# Patient Record
Sex: Male | Born: 1970 | Race: White | Hispanic: No | Marital: Married | Smoking: Former smoker
Health system: Southern US, Community
[De-identification: ages and names within clinical notes are randomized; demographics above are authoritative.]

## PROBLEM LIST (undated history)

## (undated) DIAGNOSIS — R011 Cardiac murmur, unspecified: Secondary | ICD-10-CM

## (undated) DIAGNOSIS — T7840XA Allergy, unspecified, initial encounter: Secondary | ICD-10-CM

## (undated) DIAGNOSIS — F32A Depression, unspecified: Secondary | ICD-10-CM

## (undated) DIAGNOSIS — E785 Hyperlipidemia, unspecified: Secondary | ICD-10-CM

## (undated) DIAGNOSIS — I10 Essential (primary) hypertension: Secondary | ICD-10-CM

## (undated) HISTORY — DX: Essential (primary) hypertension: I10

## (undated) HISTORY — DX: Hyperlipidemia, unspecified: E78.5

## (undated) HISTORY — DX: Allergy, unspecified, initial encounter: T78.40XA

## (undated) HISTORY — DX: Cardiac murmur, unspecified: R01.1

---
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
## 1992-12-23 HISTORY — PX: OTHER SURGICAL HISTORY: SHX169

## 2009-03-27 ENCOUNTER — Ambulatory Visit: Payer: Self-pay | Admitting: Family Medicine

## 2009-07-27 ENCOUNTER — Ambulatory Visit: Payer: Self-pay | Admitting: Family Medicine

## 2009-07-27 DIAGNOSIS — M719 Bursopathy, unspecified: Secondary | ICD-10-CM

## 2009-07-27 DIAGNOSIS — E782 Mixed hyperlipidemia: Secondary | ICD-10-CM | POA: Insufficient documentation

## 2009-07-27 DIAGNOSIS — R011 Cardiac murmur, unspecified: Secondary | ICD-10-CM | POA: Insufficient documentation

## 2009-07-27 DIAGNOSIS — M67919 Unspecified disorder of synovium and tendon, unspecified shoulder: Secondary | ICD-10-CM | POA: Insufficient documentation

## 2009-07-27 DIAGNOSIS — J309 Allergic rhinitis, unspecified: Secondary | ICD-10-CM | POA: Insufficient documentation

## 2009-07-27 DIAGNOSIS — E785 Hyperlipidemia, unspecified: Secondary | ICD-10-CM | POA: Insufficient documentation

## 2009-07-31 ENCOUNTER — Encounter: Payer: Self-pay | Admitting: Family Medicine

## 2009-08-01 LAB — CONVERTED CEMR LAB
AST: 30 units/L (ref 0–37)
BUN: 14 mg/dL (ref 6–23)
Calcium: 9.2 mg/dL (ref 8.4–10.5)
Chloride: 103 meq/L (ref 96–112)
Cholesterol: 211 mg/dL — ABNORMAL HIGH (ref 0–200)
Creatinine, Ser: 1.13 mg/dL (ref 0.40–1.50)
HDL: 44 mg/dL (ref >39)
Total CHOL/HDL Ratio: 4.8
Triglycerides: 149 mg/dL (ref <150)

## 2010-01-15 ENCOUNTER — Ambulatory Visit: Payer: Self-pay | Admitting: Occupational Medicine

## 2010-01-15 DIAGNOSIS — J020 Streptococcal pharyngitis: Secondary | ICD-10-CM | POA: Insufficient documentation

## 2010-01-26 ENCOUNTER — Ambulatory Visit: Payer: Self-pay | Admitting: Family Medicine

## 2010-01-26 DIAGNOSIS — J069 Acute upper respiratory infection, unspecified: Secondary | ICD-10-CM | POA: Insufficient documentation

## 2010-09-04 ENCOUNTER — Ambulatory Visit: Payer: Self-pay | Admitting: Family Medicine

## 2010-09-04 DIAGNOSIS — R310 Gross hematuria: Secondary | ICD-10-CM | POA: Insufficient documentation

## 2010-09-04 LAB — CONVERTED CEMR LAB
Blood in Urine, dipstick: NEGATIVE
Ketones, urine, test strip: NEGATIVE
Specific Gravity, Urine: 1.02
pH: 7.5

## 2010-09-05 ENCOUNTER — Encounter: Payer: Self-pay | Admitting: Family Medicine

## 2010-09-05 LAB — CONVERTED CEMR LAB
ALT: 36 units/L (ref 0–53)
Alkaline Phosphatase: 54 units/L (ref 39–117)
Bacteria, UA: NONE SEEN
Basophils Absolute: 0 10*3/uL (ref 0.0–0.1)
Creatinine, Ser: 1.09 mg/dL (ref 0.40–1.50)
Crystals: NONE SEEN
Eosinophils Absolute: 0.1 10*3/uL (ref 0.0–0.7)
Eosinophils Relative: 1 % (ref 0–5)
Glucose, Bld: 111 mg/dL — ABNORMAL HIGH (ref 70–99)
HCT: 45.6 % (ref 39.0–52.0)
MCV: 89.8 fL (ref 78.0–100.0)
Monocytes Absolute: 0.4 10*3/uL (ref 0.1–1.0)
Platelets: 241 10*3/uL (ref 150–400)
RDW: 13.3 % (ref 11.5–15.5)
Sodium: 137 meq/L (ref 135–145)
Total Bilirubin: 0.8 mg/dL (ref 0.3–1.2)
Total Protein: 7.6 g/dL (ref 6.0–8.3)

## 2011-01-22 NOTE — Assessment & Plan Note (Signed)
Summary: Fever, sorethroat, headache, x 2 dys rm 3   Vital Signs:  Patient Profile:   40 Years Old Male CC:      Cold & URI symptoms Height:     70.5 inches Weight:      224 pounds O2 Sat:      100 % O2 treatment:    Room Air Temp:     99.5 degrees F oral Pulse rate:   82 / minute Pulse rhythm:   regular Resp:     16 per minute BP sitting:   128 / 82  (right arm) Cuff size:   regular  Vitals Entered By: Lita Mains CMA (January 15, 2010 8:49 AM)                  Current Allergies: ! ASA     History of Present Illness Chief Complaint: Cold & URI symptoms History of Present Illness: Presents with complaints of 2 days of sore throat, low grade fever,  sinus congestion.   Can't sleep due to sore throat.    No cough.    Feels completely miserable.   Also has has  body aches.    Gets strep throat about twice a year.  Able to swallow saliva and drink liquids.   Current Problems: PHARYNGITIS, STREPTOCOCCAL (ICD-034.0) ALLERGIC RHINITIS (ICD-477.9) CARDIAC MURMUR (ICD-785.2) DYSLIPIDEMIA (ICD-272.4) ROTATOR CUFF SYNDROME, LEFT (ICD-726.10)   Current Meds HYDROCODONE-ACETAMINOPHEN 5-325 MG TABS (HYDROCODONE-ACETAMINOPHEN) 1 by mouth up to 4 times per day as needed for pain  REVIEW OF SYSTEMS Constitutional Symptoms       Complains of fever.     Denies chills, night sweats, weight loss, weight gain, and fatigue.  Eyes       Denies change in vision, eye pain, eye discharge, glasses, contact lenses, and eye surgery. Ear/Nose/Throat/Mouth       Complains of frequent runny nose, sinus problems, and sore throat.      Denies hearing loss/aids, change in hearing, ear pain, ear discharge, dizziness, frequent nose bleeds, hoarseness, and tooth pain or bleeding.      Comments: x 2 dys Respiratory       Denies dry cough, productive cough, wheezing, shortness of breath, asthma, bronchitis, and emphysema/COPD.  Cardiovascular       Denies murmurs, chest pain, and tires easily with  exhertion.    Gastrointestinal       Denies stomach pain, nausea/vomiting, diarrhea, constipation, blood in bowel movements, and indigestion. Genitourniary       Denies painful urination, kidney stones, and loss of urinary control. Neurological       Complains of headaches.      Denies paralysis, seizures, and fainting/blackouts. Musculoskeletal       Denies muscle pain, joint pain, joint stiffness, decreased range of motion, redness, swelling, muscle weakness, and gout.  Skin       Denies bruising, unusual mles/lumps or sores, and hair/skin or nail changes.  Psych       Denies mood changes, temper/anger issues, anxiety/stress, speech problems, depression, and sleep problems.  Past History:  Past Medical History: Last updated: 07/27/2009 heart murmur allergies  Past Surgical History: Last updated: 07/27/2009 981 L knee lateral release 1994 R knee lateral release  Family History: Last updated: 07/27/2009 Mother alive Alzheimers in her late 54s Father AMI at 81, HTN. 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  89 yo daughter. Quit smoking in 2005. Cardio/ wts 60 + min 3-4 days/ wk. Goal wt is 200, down from 260. 6-10 ETOH/ wk. Physical Exam General appearance: well developed, well nourished, no acute distress Head: normocephalic, atraumatic Eyes: conjunctivae and lids normal Pupils: equal, round, reactive to light Ears: normal, no lesions or deformities Oral/Pharynx: pharyngeal erythema with exudate, uvula midline without deviation Neck: supple,anterior lymphadenopathy present Chest/Lungs: no rales, wheezes, or rhonchi bilateral, breath sounds equal without effort Assessment New Problems: PHARYNGITIS, STREPTOCOCCAL (ICD-034.0)   Plan New Medications/Changes: HYDROCODONE-ACETAMINOPHEN 5-325 MG TABS  (HYDROCODONE-ACETAMINOPHEN) 1 by mouth up to 4 times per day as needed for pain  #20 x 0, 01/15/2010, Kathrine Haddock MD  New Orders: Est. Patient Level III [id number] Admin of Therapeutic Inj  intramuscular or subcutaneous [96372] Planning Comments:   Biacillin 1.2 million units IM now Viscous lidocaine sample given Hydrocodone for pain Follow up if not better in 2-3 days.   The patient and/or caregiver has been counseled thoroughly with regard to medications prescribed including dosage, schedule, interactions, rationale for use, and possible side effects and they verbalize understanding.  Diagnoses and expected course of recovery discussed and will return if not improved as expected or if the condition worsens. Patient and/or caregiver verbalized understanding.  Prescriptions: HYDROCODONE-ACETAMINOPHEN 5-325 MG TABS (HYDROCODONE-ACETAMINOPHEN) 1 by mouth up to 4 times per day as needed for pain  #20 x 0   Entered and Authorized by:   Kathrine Haddock MD   Signed by:   Kathrine Haddock MD on 01/15/2010   Method used:   Print then Give to Patient   RxID:   [id number]    Medication Administration  Injection # 1:    Medication: Bicillin CR 1.2 million units Injection    Diagnosis: PHARYNGITIS, STREPTOCOCCAL (ICD-034.0)    Route: IM    Site: RUOQ gluteus    Exp Date: 07/23/2012    Lot #: [id number]    Mfr: Brooke Dare     Patient tolerated injection without complications    Given by: Lita Mains RN (January 15, 2010 9:37 AM)  Orders Added: 1)  Est. Patient Level III [30865] 2)  Admin of Therapeutic Inj  intramuscular or subcutaneous [78469]

## 2011-01-22 NOTE — Assessment & Plan Note (Signed)
Summary: Blood in urine   Vital Signs:  Patient profile:   40 year old male Height:      70.5 inches Weight:      226 pounds BMI:     32.08 Pulse rate:   86 / minute BP sitting:   145 / 80  (right arm) Cuff size:   regular  Vitals Entered By: Avon Gully CMA, Duncan Dull) (September 04, 2010 8:22 AM) CC: noticed bld in the urine yesterday, slight discomfort ofater urinating yesterday.   Primary Care Provider:  Seymour Bars DO  CC:  noticed bld in the urine yesterday and slight discomfort ofater urinating yesterday.Marland Kitchen  History of Present Illness: noticed bld in the urine yesterday, slight discomfort ofater urinating yesterday. Few dropsat the end of the penis. Happened after a BM. No hx of kidney stones. Drinks mainly water or coffee. No dysuria, fever or low back pain.  Occ has sharp pains in her groin area.  Usually would go away. No trauma.   Current Medications (verified): 1)  None  Allergies (verified): 1)  ! Asa  Comments:  Nurse/Medical Assistant: The patient's medications and allergies were reviewed with the patient and were updated in the Medication and Allergy Lists. 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  31 yo daughter. Quit smoking in 2005. Cardio/ wts 60 + min 3-4 days/ wk. Goal wt is 200, down from 260. 6-10 ETOH/ wk.  Physical Exam  General:  Well-developed,well-nourished,in no acute distress; alert,appropriate and cooperative throughout examination Head:  Normocephalic and atraumatic without obvious abnormalities. No apparent alopecia or balding. Lungs:  Normal respiratory effort, chest expands symmetrically. Lungs are clear to auscultation, no crackles  or wheezes. Heart:  Normal rate and regular rhythm. S1 and S2 normal without gallop, murmur, click, rub or other extra sounds. Abdomen:  Bowel sounds positive,abdomen soft and non-tender without masses, organomegaly or hernias noted. Msk:  No CVA tenderness.  Skin:  no rashes.   Psych:  Cognition and judgment appear intact. Alert and cooperative with normal attention span and concentration. No apparent delusions, illusions, hallucinations   Impression & Recommendations:  Problem # 1:  GROSS HEMATURIA (ICD-599.71) Discussed unclear etiology at this point. UA today is neg except for potein.He denies any creatine drinks, etc.  Will send micor and culture. Also Check BUN and CR and CBC. If all normal consider repeat UA in 1 week to check for protein again.  Also no pain to suspect urinary stones. He is a former smoker but he is young to have bladder cancer.   The following medications were removed from the medication list:    Clarithromycin 500 Mg Tabs (Clarithromycin) ..... One tab by mouth two times a day  Orders: T-Comprehensive Metabolic Panel [id number]) T-CBC w/Diff [phone]) T-Urine Culture (Spectrum Order) [id number]) T-Urine Microscopic [phone])  Other Orders: UA Dipstick w/o Micro (automated)  (81003)  Contraindications/Deferment of Procedures/Staging:    Test/Procedure: FLU VAX    Reason for deferment: patient declined   Patient Instructions: 1)  We will call you with your lab results later this week.  2)  Call if you have another episode.   Laboratory Results   Urine Tests  Date/Time Received: 09/04/10 Date/Time Reported: 09/04/10  Routine Urinalysis   Color: yellow  Appearance: Clear Glucose: negative   (Normal Range: Negative) Bilirubin: negative   (Normal Range: Negative) Ketone:   negative   (Normal Range: Negative) Spec. Gravity: 1.020   (Normal Range: 1.003-1.035) Blood: negative   (Normal Range: Negative) pH: 7.5   (Normal Range:  5.0-8.0) Protein: trace   (Normal Range: Negative) Urobilinogen: 0.2   (Normal Range: 0-1) Nitrite: negative   (Normal Range: Negative) Leukocyte Esterace: negative   (Normal Range: Negative)

## 2011-01-22 NOTE — Assessment & Plan Note (Signed)
Summary: CHILLS, BODY ACHING/WB   Vital Signs:  Patient Profile:   39 Years Old Male CC:      chills, body aches, fever Height:     70.5 inches O2 Sat:      99 % O2 treatment:    Room Air Temp:     100.0 degrees F oral Pulse rate:   89 / minute Pulse rhythm:   regular Resp:     20 per minute BP sitting:   148 / 89  (right arm) Cuff size:   regular  Pt. in pain?   no  Vitals Entered By: Lita Mains, RN                   Updated Prior Medication List: HYDROCODONE-ACETAMINOPHEN 5-325 MG TABS (HYDROCODONE-ACETAMINOPHEN) 1 by mouth up to 4 times per day as needed for pain  Current Allergies (reviewed today): ! ASAHistory of Present Illness History from: patient Chief Complaint: chills, body aches, fever History of Present Illness: Subjective:  Patient reports that his previous sore throat completely resolved and he felt well.  Yesterday he developed myalgias, chills, fatigue, and tightness over his sternum.  No definite respiratory symptoms.  No GI or GU symptoms.  He has not had a flu shot. He has had six episodes of pneumonia in the past.  REVIEW OF SYSTEMS Constitutional Symptoms       Complains of fever and chills.     Denies night sweats, weight loss, weight gain, and fatigue.      Comments: body aches Eyes       Denies change in vision, eye pain, eye discharge, glasses, contact lenses, and eye surgery. Ear/Nose/Throat/Mouth       Denies hearing loss/aids, change in hearing, ear pain, ear discharge, dizziness, frequent runny nose, frequent nose bleeds, sinus problems, sore throat, hoarseness, and tooth pain or bleeding.  Respiratory       Denies dry cough, productive cough, wheezing, shortness of breath, asthma, bronchitis, and emphysema/COPD.  Cardiovascular       Denies murmurs, chest pain, and tires easily with exhertion.    Gastrointestinal       Denies stomach pain, nausea/vomiting, diarrhea, constipation, blood in bowel movements, and  indigestion. Genitourniary       Denies painful urination, kidney stones, and loss of urinary control. Neurological       Denies paralysis, seizures, and fainting/blackouts. Musculoskeletal       Denies muscle pain, joint pain, joint stiffness, decreased range of motion, redness, swelling, muscle weakness, and gout.  Skin       Denies bruising, unusual mles/lumps or sores, and hair/skin or nail changes.  Psych       Denies mood changes, temper/anger issues, anxiety/stress, speech problems, depression, and sleep problems.  Past History:  Past Medical History: Reviewed history from 07/27/2009 and no changes required. heart murmur allergies  Family History: Reviewed history from 07/27/2009 and no changes required. Mother alive Alzheimers in her late 57s Father AMI at 72, HTN. cousins on mom/ dad's side breast cancer aunt: lung cancer nephew depression 2 brothers/ 1 sister healthy  Social History: Reviewed history from 07/27/2009 and no changes required. 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  58 yo daughter. Quit smoking in 2005. Cardio/ wts 60 + min 3-4 days/ wk. Goal wt is 200, down from 260. 6-10 ETOH/ wk.   Objective:  Appearance:  Patient appears healthy, stated age, and in no acute distress  Eyes:  Pupils are equal, round, and reactive  to light and accomdation.  Extraocular movement is intact.  Conjunctivae are not inflamed.  Ears:  Canals normal.  Tympanic membranes normal.   Nose:  Normal septum.  Normal turbinates, mildly congested.   No sinus tenderness present.  Pharynx:  Normal, moist mucous membranes  Neck:  Supple.  No adenopathy is present.  No thyromegaly is present  Lungs:  Clear to auscultation.  Breath sounds are equal.  Heart:  Regular rate and rhythm without murmurs, rubs, or gallops.  Abdomen:  Nontender without masses or hepatosplenomegaly.  Bowel sounds are present.  No CVA or flank tenderness.  Assessment New Problems: URI (ICD-465.9)  ?  EARLY INFLUENZA VS VIRAL URI  Plan New Medications/Changes: BENZONATATE 200 MG CAPS (BENZONATATE) One by mouth hs as needed cough  #12 x 0, 01/26/2010, Donna Christen MD CLARITHROMYCIN 500 MG TABS (CLARITHROMYCIN) One Tab by mouth two times a day  #20 x 0, 01/26/2010, Donna Christen MD  New Orders: Est. Patient Level III [phone] Planning Comments:   With past history of multiple episodes of pneumonia, will begin Biaxin for 10 days.  Begin expectorant and increased fluids. Cough suppressant at night if cough worsens.  Follow-up with PCP if not improving one week. When well, recommend Pneumovax and Tdap.   The patient and/or caregiver has been counseled thoroughly with regard to medications prescribed including dosage, schedule, interactions, rationale for use, and possible side effects and they verbalize understanding.  Diagnoses and expected course of recovery discussed and will return if not improved as expected or if the condition worsens. Patient and/or caregiver verbalized understanding.  Prescriptions: BENZONATATE 200 MG CAPS (BENZONATATE) One by mouth hs as needed cough  #12 x 0   Entered and Authorized by:   Donna Christen MD   Signed by:   Donna Christen MD on 01/26/2010   Method used:   Print then Give to Patient   RxID:   [id number] CLARITHROMYCIN 500 MG TABS (CLARITHROMYCIN) One Tab by mouth two times a day  #20 x 0   Entered and Authorized by:   Donna Christen MD   Signed by:   Donna Christen MD on 01/26/2010   Method used:   Print then Give to Patient   RxID:   [id number]   Patient Instructions: 1)  May use Mucinex  (guaifenesin) twice daily for congestion. 2)  Increase fluid intake, rest. 3)  May use Afrin nasal spray (or generic oxymetazoline) twice daily for about 5 days.  Also recommend using saline nasal spray several times daily and/or saline nasal irrigation. 4)  Followup with family doctor if not improving one week.

## 2011-06-26 ENCOUNTER — Encounter: Payer: Self-pay | Admitting: Family Medicine

## 2011-06-27 ENCOUNTER — Encounter: Payer: Self-pay | Admitting: Family Medicine

## 2011-06-27 ENCOUNTER — Ambulatory Visit (INDEPENDENT_AMBULATORY_CARE_PROVIDER_SITE_OTHER): Payer: BC Managed Care – PPO | Admitting: Family Medicine

## 2011-06-27 DIAGNOSIS — Z13220 Encounter for screening for lipoid disorders: Secondary | ICD-10-CM

## 2011-06-27 DIAGNOSIS — E785 Hyperlipidemia, unspecified: Secondary | ICD-10-CM

## 2011-06-27 DIAGNOSIS — M7710 Lateral epicondylitis, unspecified elbow: Secondary | ICD-10-CM

## 2011-06-27 DIAGNOSIS — Z130 Encounter for screening for diseases of the blood and blood-forming organs and certain disorders involving the immune mechanism: Secondary | ICD-10-CM

## 2011-06-27 LAB — LIPID PANEL
Cholesterol: 235 mg/dL — ABNORMAL HIGH (ref 0–200)
HDL: 41 mg/dL (ref >39)
LDL Cholesterol: 159 mg/dL — ABNORMAL HIGH (ref 0–99)
Total CHOL/HDL Ratio: 5.7 Ratio
Triglycerides: 176 mg/dL — ABNORMAL HIGH (ref <150)
VLDL: 35 mg/dL (ref 0–40)

## 2011-06-27 NOTE — Patient Instructions (Signed)
Labs today. Will call you w/ results tomorrow.  Try cho pat strap for tennis elbow, ice massage, ibuprofen and if not improved in 2-3 wks, call me and I'll set you up with sports med for injection.

## 2011-06-27 NOTE — Assessment & Plan Note (Signed)
Will try him on a Cho Pat strap, ice massage and ibuprofen, avoiding repetitive work for the next 2 wks.  Call if not improving in 2-3 wks and will get him in with sports med if not improving.

## 2011-06-27 NOTE — Assessment & Plan Note (Signed)
Hx of high cholesterol and was told that his cholesterol was high at work.  Will repeat FASTING today and f/u results tomorrow, off meds.

## 2011-06-27 NOTE — Progress Notes (Signed)
  Subjective:    Patient ID: Hector Chavez, male    DOB: [birth year], 40 y.o.   MRN: [id number]  HPI  40 yo WM presents for a health assesment done at work.  He was told that his cholesterol was high but he was not fasting.  He was also told that his sugar was elevated but he not fasting.  He had been on cholesterol meds about 2 yrs ago.    He has pain in the L elbow for a couple months over the L side.  Denies overuse at work or playing raquet sports.  Has used ice, ibuprofen with some help.  No redness or swelling.  Worse if he picks up a gallon of milk from the fridge.    BP 129/78  Pulse 74  Wt 228 lb (103.42 kg)  SpO2 98%   Review of Systems  Respiratory: Negative for shortness of breath.   Cardiovascular: Negative for chest pain.  Musculoskeletal: Positive for arthralgias (L elbow only). Negative for myalgias and joint swelling.  Skin: Negative for color change.       Objective:   Physical Exam  Constitutional: He appears well-developed and well-nourished.  Neck: No thyromegaly present.  Cardiovascular: Normal rate, regular rhythm and normal heart sounds.   Pulmonary/Chest: Effort normal and breath sounds normal.  Musculoskeletal: Normal range of motion. He exhibits no edema.       Left elbow: He exhibits normal range of motion, no swelling and no effusion. tenderness found. Lateral epicondyle tenderness noted.  Neurological:       Grip + 5/5 bilat  Skin: Skin is warm and dry. No erythema.          Assessment & Plan:

## 2011-06-28 ENCOUNTER — Telehealth: Payer: Self-pay | Admitting: Family Medicine

## 2011-06-28 DIAGNOSIS — E785 Hyperlipidemia, unspecified: Secondary | ICD-10-CM | POA: Insufficient documentation

## 2011-06-28 LAB — COMPLETE METABOLIC PANEL WITH GFR
ALT: 43 U/L (ref 0–53)
AST: 32 U/L (ref 0–37)
Alkaline Phosphatase: 46 U/L (ref 39–117)
BUN: 16 mg/dL (ref 6–23)
Calcium: 9.5 mg/dL (ref 8.4–10.5)
Chloride: 106 mEq/L (ref 96–112)
Creat: 1.06 mg/dL (ref 0.50–1.35)

## 2011-06-28 MED ORDER — ROSUVASTATIN CALCIUM 5 MG PO TABS: 5.0000 mg | ORAL_TABLET | Freq: Every day | ORAL | Status: DC

## 2011-06-28 NOTE — Telephone Encounter (Signed)
Pls let pt know that his fasting sugar, liver and kidney function came back normal.  His cholesterol is elevated at 235, TGs elevated at 176, LDL bad chol is 159 and it should be < 100.  Will start him on cholesterol lowering medication to get him to goal.    RX sent to pharmacy for Crestor 5 mg qhs.  Call if any problems.  Repeat labs in 8 wks.

## 2011-06-28 NOTE — Telephone Encounter (Signed)
Pt aware of the above  

## 2012-12-14 ENCOUNTER — Telehealth: Payer: Self-pay | Admitting: *Deleted

## 2012-12-14 MED ORDER — OSELTAMIVIR PHOSPHATE 75 MG PO CAPS: 75.0000 mg | ORAL_CAPSULE | Freq: Two times a day (BID) | ORAL | Status: DC

## 2012-12-14 NOTE — Telephone Encounter (Signed)
Pt would like a rx for tamiflu sent to [facility] in [place] because his daughter has been dx with the flu

## 2012-12-14 NOTE — Telephone Encounter (Signed)
Rx sent for Mr and Mrs Col[facility]oun 

## 2012-12-14 NOTE — Telephone Encounter (Signed)
Pt aware.

## 2016-11-30 ENCOUNTER — Emergency Department (INDEPENDENT_AMBULATORY_CARE_PROVIDER_SITE_OTHER): Payer: BLUE CROSS/BLUE SHIELD

## 2016-11-30 ENCOUNTER — Encounter: Payer: Self-pay | Admitting: Emergency Medicine

## 2016-11-30 ENCOUNTER — Emergency Department
Admission: EM | Admit: 2016-11-30 | Discharge: 2016-11-30 | Disposition: A | Discharge status: Home / Self Care | Payer: BLUE CROSS/BLUE SHIELD | Source: Home / Self Care | Attending: Emergency Medicine | Admitting: Emergency Medicine

## 2016-11-30 DIAGNOSIS — M79641 Pain in right hand: Secondary | ICD-10-CM

## 2016-11-30 DIAGNOSIS — M25521 Pain in right elbow: Secondary | ICD-10-CM

## 2016-11-30 DIAGNOSIS — M25541 Pain in joints of right hand: Secondary | ICD-10-CM

## 2016-11-30 DIAGNOSIS — M778 Other enthesopathies, not elsewhere classified: Secondary | ICD-10-CM

## 2016-11-30 NOTE — ED Provider Notes (Signed)
[facility]    CSN: [id number] Arrival date & time: 11/30/16  1249     History   Chief Complaint Chief Complaint  Patient presents with  . Elbow Pain    right    HPI Hector Chavez is a 45 y.o. male.   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  Took tylenol this morning.-It helped minimally. The pain is sharp and dull, right lateral elbow, exacerbated by handgrip. Now he feels the pain can radiate into the right lateral hand fourth and fifth metacarpal area. No definite numbness or weakness. Denies chest pain or shortness of breath or GI symptoms or fever or chills or nausea or vomiting.   Past Medical History:  Diagnosis Date  . Allergy   . Heart murmur     Patient Active Problem List   Diagnosis Date Noted  . Hyperlipidemia 06/28/2011  . Lateral epicondylitis 06/27/2011  . GROSS HEMATURIA 09/04/2010  . DYSLIPIDEMIA 07/27/2009  . ALLERGIC RHINITIS 07/27/2009  . ROTATOR CUFF SYNDROME, LEFT 07/27/2009  . CARDIAC MURMUR 07/27/2009    Past Surgical History:  Procedure Laterality Date  . LT knee lateral release  1981  . RT knee lateral release  1994       Home Medications    Prior to Admission medications   Medication Sig Start Date End Date Taking? Authorizing Provider  amphetamine-dextroamphetamine (ADDERALL XR) 20 MG 24 hr capsule Take 20 mg by mouth daily.   Yes Historical Provider, MD  oseltamivir (TAMIFLU) 75 MG capsule Take 1 capsule (75 mg total) by mouth 2 (two) times daily. 12/14/12   Sean Hommel, DO  rosuvastatin (CRESTOR) 5 MG tablet Take 1 tablet (5 mg total) by mouth at bedtime. 06/28/11 06/27/12  Glennis BrinkKaren E Bowen, DO    Family History Family History  Problem Relation Age of Onset  . Dementia Mother 1860    (alzheimers)  . Heart attack Father 660  . Hypertension Father   .  Cancer      lung/aunt/breast/cousins/mom and dads side  . Depression      nephew    Social History Social History  Substance Use Topics  . Smoking status: Former Smoker    Quit date: 12/24/2003  . Smokeless tobacco: Never Used  . Alcohol use 0.0 oz/week    6 - 10 Standard drinks or equivalent per week     Comment: per weel     Allergies   Aspirin   Review of Systems Review of Systems  All other systems reviewed and are negative.    Physical Exam Triage Vital Signs ED Triage Vitals  Enc Vitals Group     BP 11/30/16 1318 132/82     Pulse Rate 11/30/16 1318 71     Resp 11/30/16 1318 16     Temp 11/30/16 1318 98.1 F (36.7 C)     Temp Source 11/30/16 1318 Oral     SpO2 11/30/16 1318 97 %     Weight --      Height 11/30/16 1319 5\' 11"  (1.803 m)     Head Circumference --      Peak Flow --      Pain Score 11/30/16 1320 7     Pain Loc --      Pain Edu? --      Excl. in GC? --  No data found.   Updated Vital Signs BP 132/82 (BP Location: Left Arm)   Pulse 71   Temp 98.1 F (36.7 C) (Oral)   Resp 16   Ht 5\' 11"  (1.803 m)   SpO2 97%   Visual Acuity Right Eye Distance:   Left Eye Distance:   Bilateral Distance:    Right Eye Near:   Left Eye Near:    Bilateral Near:     Physical Exam  Constitutional: He is oriented to person, place, and time. He appears well-developed and well-nourished. No distress.  HENT:  Head: Normocephalic and atraumatic.  Eyes: Pupils are equal, round, and reactive to light. No scleral icterus.  Neck: Normal range of motion. Neck supple.  Cardiovascular: Normal rate and regular rhythm.   Pulmonary/Chest: Effort normal.  Abdominal: He exhibits no distension.  Musculoskeletal:  Very tender, mild swelling right lateral elbow. Right lateral elbow pain exacerbated by handgrip and extension of wrist. Mild decreased range of motion of right elbow. No open wound. Right wrist: No tenderness or deformity Right hand: Mild to moderate  tenderness to deep palpation over fourth and fifth metacarpals, with minimal swelling. No ecchymosis. Neurovascular distally intact.  Neurological: He is alert and oriented to person, place, and time. No cranial nerve deficit.  Skin: Skin is warm and dry.  Psychiatric: He has a normal mood and affect. His behavior is normal.  Vitals reviewed.    UC Treatments / Results  Labs (all labs ordered are listed, but only abnormal results are displayed) Labs Reviewed - No data to display  EKG  EKG Interpretation None       Radiology Dg Elbow Complete Right  Result Date: 11/30/2016 CLINICAL DATA:  Right with pain and swelling 2 months.  No injury. EXAM: RIGHT ELBOW - COMPLETE 3+ VIEW COMPARISON:  None. FINDINGS: Well-defined pedunculated osteochondroma over the distal humeral diametaphyseal region. No evidence of acute fracture or dislocation. IMPRESSION: No acute findings. Electronically Signed   By: Elberta Fortisaniel  Boyle M.D.   On: 11/30/2016 15:08   Dg Hand Complete Right  Result Date: 11/30/2016 CLINICAL DATA:  Right hand pain and swelling with weakness 2 months. No injury. EXAM: RIGHT HAND - COMPLETE 3+ VIEW COMPARISON:  None. FINDINGS: Mild degenerate change of the radiocarpal joint. Alignment and bone mineralization are normal. No significant erosive change. No acute fracture or dislocation. IMPRESSION: No acute findings. Mild degenerate change of the radiocarpal joint. Electronically Signed   By: Elberta Fortisaniel  Boyle M.D.   On: 11/30/2016 15:10    Procedures Procedures (including critical care time)  Medications Ordered in UC Medications - No data to display   Initial Impression / Assessment and Plan / UC Course  I have reviewed the triage vital signs and the nursing notes.  Pertinent labs & imaging results that were available during my care of the patient were reviewed by me and considered in my medical decision making (see chart for details).  Clinical Course as of Dec 01 2019  Sat Nov 30, 2016  1440 Patient examined. X-rays ordered  [DM]  1522 X-ray right hand and elbow negative for any fracture or acute abnormality. Reviewed and discussed with patient. Gave him print out of x-ray reports.  [DM]    Clinical Course User Index [DM] Lajean Manesavid Massey, MD   Gave various written information on tennis elbow and tendinitis of elbow, including modification of worksite ergonomically as he is on a computer at work all day.  Special splint for tennis elbow applied right  elbow with instructions Gave copy of x-ray reports He prefers to take ibuprofen 600 mg 3 times a day with food Follow-up with your orthopedist in 5-7 days if not improving, or sooner if symptoms become worse. Precautions discussed. Red flags discussed. Questions invited and answered. Patient voiced understanding and agreement.   Final Clinical Impressions(s) / UC Diagnoses   Final diagnoses:  Right elbow tendinitis  Right hand pain    New Prescriptions Discharge Medication List as of 11/30/2016  3:45 PM    Ibuprofen 600 mg 3 times a day with food.   Lajean Manes, MD 11/30/16 2021

## 2016-11-30 NOTE — ED Triage Notes (Signed)
Patient reports right elbow pain that radiates to wrist and hand for "couple months"; no history of injury or strain. Took tylenol this morning.

## 2016-11-30 NOTE — ED Triage Notes (Signed)
Patient rolled out of bed last night and landed on right lower arm/elbow and hit left temple of forehead; cradling arm; feels light headed and states empty stomach; took nothing for pain.

## 2016-11-30 NOTE — ED Triage Notes (Signed)
2:01 entry erroneous. pak

## 2017-01-22 ENCOUNTER — Other Ambulatory Visit: Payer: Self-pay | Admitting: *Deleted

## 2017-01-22 MED ORDER — OSELTAMIVIR PHOSPHATE 75 MG PO CAPS: 75.0000 mg | ORAL_CAPSULE | Freq: Two times a day (BID) | ORAL | 0 refills | Status: DC

## 2017-01-22 NOTE — Telephone Encounter (Signed)
Informed pt's wife that he will need to est care. She made an appt for him. Advised that he should arrive 15 mins early to complete forms. Laureen Ochs.Patria Warzecha, Viann Shoveonya Lynetta

## 2017-01-22 NOTE — Telephone Encounter (Signed)
Pt's wife called and asked if Dr. Linford ArnoldMetheney could send rx for tamiflu for her husband. Their daughter was dx with flu today.Loralee PacasBarkley, Kenyada Hy WatervilleLynetta

## 2017-01-30 ENCOUNTER — Other Ambulatory Visit: Payer: Self-pay

## 2017-01-30 ENCOUNTER — Encounter: Payer: Self-pay | Admitting: Physician Assistant

## 2017-01-30 ENCOUNTER — Ambulatory Visit (INDEPENDENT_AMBULATORY_CARE_PROVIDER_SITE_OTHER): Payer: BLUE CROSS/BLUE SHIELD | Admitting: Physician Assistant

## 2017-01-30 VITALS — BP 138/90 | HR 67 | Ht 71.0 in | Wt 251.0 lb

## 2017-01-30 DIAGNOSIS — Z0000 Encounter for general adult medical examination without abnormal findings: Secondary | ICD-10-CM | POA: Diagnosis not present

## 2017-01-30 DIAGNOSIS — R002 Palpitations: Secondary | ICD-10-CM

## 2017-01-30 DIAGNOSIS — G4719 Other hypersomnia: Secondary | ICD-10-CM

## 2017-01-30 DIAGNOSIS — R0683 Snoring: Secondary | ICD-10-CM

## 2017-01-30 DIAGNOSIS — N411 Chronic prostatitis: Secondary | ICD-10-CM

## 2017-01-30 DIAGNOSIS — Z8774 Personal history of (corrected) congenital malformations of heart and circulatory system: Secondary | ICD-10-CM | POA: Diagnosis not present

## 2017-01-30 DIAGNOSIS — F321 Major depressive disorder, single episode, moderate: Secondary | ICD-10-CM

## 2017-01-30 DIAGNOSIS — E782 Mixed hyperlipidemia: Secondary | ICD-10-CM

## 2017-01-30 DIAGNOSIS — N401 Enlarged prostate with lower urinary tract symptoms: Secondary | ICD-10-CM

## 2017-01-30 DIAGNOSIS — R3914 Feeling of incomplete bladder emptying: Secondary | ICD-10-CM

## 2017-01-30 LAB — LIPID PANEL W/REFLEX DIRECT LDL
Cholesterol: 251 mg/dL — ABNORMAL HIGH (ref <200)
HDL: 49 mg/dL (ref >40)
LDL-Cholesterol: 178 mg/dL — ABNORMAL HIGH
Non-HDL Cholesterol (Calc): 202 mg/dL — ABNORMAL HIGH (ref <130)
Total CHOL/HDL Ratio: 5.1 Ratio — ABNORMAL HIGH (ref <5.0)
Triglycerides: 115 mg/dL (ref <150)

## 2017-01-30 LAB — URINALYSIS, ROUTINE W REFLEX MICROSCOPIC
BILIRUBIN URINE: NEGATIVE
GLUCOSE, UA: NEGATIVE
HGB URINE DIPSTICK: NEGATIVE
KETONES UR: NEGATIVE
LEUKOCYTES UA: NEGATIVE
Nitrite: NEGATIVE
PROTEIN: NEGATIVE
Specific Gravity, Urine: 1.021 (ref 1.001–1.035)
pH: 7 (ref 5.0–8.0)

## 2017-01-30 LAB — CBC
HEMATOCRIT: 46.5 % (ref 38.5–50.0)
HEMOGLOBIN: 15.8 g/dL (ref 13.2–17.1)
MCH: 30.2 pg (ref 27.0–33.0)
MCHC: 34 g/dL (ref 32.0–36.0)
MCV: 88.9 fL (ref 80.0–100.0)
MPV: 9.6 fL (ref 7.5–12.5)
Platelets: 249 10*3/uL (ref 140–400)
RBC: 5.23 MIL/uL (ref 4.20–5.80)
RDW: 14 % (ref 11.0–15.0)
WBC: 6.7 10*3/uL (ref 3.8–10.8)

## 2017-01-30 LAB — COMPREHENSIVE METABOLIC PANEL
ALT: 51 U/L — ABNORMAL HIGH (ref 9–46)
AST: 39 U/L (ref 10–40)
Albumin: 4.7 g/dL (ref 3.6–5.1)
Alkaline Phosphatase: 54 U/L (ref 40–115)
BUN: 13 mg/dL (ref 7–25)
CALCIUM: 9.4 mg/dL (ref 8.6–10.3)
CO2: 26 mmol/L (ref 20–31)
CREATININE: 1.12 mg/dL (ref 0.60–1.35)
Chloride: 103 mmol/L (ref 98–110)
GLUCOSE: 102 mg/dL — AB (ref 65–99)
Potassium: 4.3 mmol/L (ref 3.5–5.3)
Sodium: 139 mmol/L (ref 135–146)
Total Bilirubin: 0.6 mg/dL (ref 0.2–1.2)
Total Protein: 7.7 g/dL (ref 6.1–8.1)

## 2017-01-30 MED ORDER — CIPROFLOXACIN HCL 750 MG PO TABS: 750.0000 mg | ORAL_TABLET | Freq: Two times a day (BID) | ORAL | 0 refills | Status: AC

## 2017-01-30 MED ORDER — ESCITALOPRAM OXALATE 20 MG PO TABS: ORAL_TABLET | ORAL | 1 refills | Status: DC

## 2017-01-30 NOTE — Patient Instructions (Addendum)
Go downstairs for blood work. We will contact you within 48 hours with results  Take Antibiotic (cipro) twice a day for 14 days Make an appointment with your Urologist for follow-up  Start Lexapro 1/2 tab (10mg ) daily for 1 week. Then increase to full tab (20mg ) daily. I am also prescribing 20-30 minutes of cardiovascular exercise (brisk walk, jog, biking) at least 2 days per week. Follow-up in 1 month  I have placed a referral for a sleep study. They will be contacting you within 5 business days  I have also ordered an Echocardiogram (ultrasound of your heart). [place] take place [place]. They will contact you to schedule

## 2017-01-30 NOTE — Progress Notes (Signed)
HPI:                                                                Hector Chavez is a 46 y.o. male who [facility] today to establish care  Current Concerns include  Hx of Prostatitis - saw urology 2 years ago for asymptomatic hematospermia. Treated empirically with doxycycline. Recommended transrectal ultrasound if symptoms persisted, but patient was lost to follow-up. Patient states for the last few months he has had brownish ejaculatory discharge and pain with ejaculation. Additionally he endorses some obstructive symptoms including incomplete bladder emptying, frequency, and interrupted urinary flow.  Palpitations/history of murmur: patient states he has a history of CHD for which he was followed by pediatric cardiology on a regular basis. He is unsure of his exact cardiac abnormality, but thinks it was possibly a septal defect and mitral valve prolapse. Patient states that recently he has experienced palpitations at rest lasting for a few seconds. Denies associated chest pain, diaphoresis, or presyncope. Denies any episodes of chest pain or syncope with exertion. He does take Adderall 30mg  daily for ADHD. Patient also endorses snoring and that his wife has witnessed periods where he has stopped breathing in his sleep. He has never had a sleep study. Family hx is significant for HTN and MI in his father.  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 He is working long hours and no longer has time for exercise. He reports he has gained weight. He endorses anhedonia, hopelessness, and suicidal thoughts. He does not have a plan and has no history of suicidal behavior. He reports that he has a good support system at home. Denies AH/VH.   Health Maintenance Health Maintenance  Topic Date Due  . HIV Screening  04/18/1984  . TETANUS/TDAP  04/18/1988  . INFLUENZA  VACCINE  08/23/2017 (Originally 07/23/2016)     Health Habits  Diet: "poor"  Exercise: no  ETOH: 15 drinks/week  Tobacco: no, former  Drugs: no  Past Medical History:  Diagnosis Date  . Allergy   . Heart murmur   . Hyperlipidemia    Past Surgical History:  Procedure Laterality Date  . LT knee lateral release  1981  . RT knee lateral release  1994   Social History  Substance Use Topics  . Smoking status: Former Smoker    Quit date: 12/24/2003  . Smokeless tobacco: Never Used  . Alcohol use 0.0 oz/week    6 - 10 Standard drinks or equivalent per week     Comment: per weel   family history includes Brain cancer in his father; Dementia (age of onset: 1460) in his mother; Heart attack (age of onset: 3260) in his father; Hypertension in his father.  ROS: negative except as noted in the HPI  Medications: Current Outpatient Prescriptions  Medication Sig Dispense Refill  . amphetamine-dextroamphetamine (ADDERALL XR) 20 MG 24 hr capsule Take 20 mg by mouth daily.    . ciprofloxacin (CIPRO) 750 MG tablet Take 1 tablet (750 mg total) by mouth 2 (two) times daily. 28 tablet 0  . escitalopram (LEXAPRO) 20 MG tablet One half tab daily for a week then one tab by  mouth daily 30 tablet 1   No current facility-administered medications for this visit.    Allergies  Allergen Reactions  . Aspirin     REACTION: Dizzy, flushed in face       Objective:  BP 138/90   Pulse 67   Ht 5\' 11"  (1.803 m)   Wt 251 lb (113.9 kg)   BMI 35.01 kg/m  Gen: well-groomed, cooperative, not ill-appearing, no distress HEENT: normal conjunctiva, TM's clear, oropharynx clear, moist mucus membranes, no thyromegaly or tenderness Pulm: Normal work of breathing, clear to auscultation bilaterally CV: Normal rate, regular rhythm, s1 and s2 distinct, no murmurs, clicks or rubs appreciated on this exam, no carotid bruit GI: soft, obese, nondistended, nontender, no masses Neuro: alert and oriented x 3, EOM's  intact, PERRLA, DTR's intact MSK: strength 5/5 and symmetric, normal gait, distal pulses intact, no peripheral edema Skin: warm and dry, no rashes or lesions on exposed skin Psych: becomes tearful when talking about his job and mood, normal speech and thought content, good insight    No results found.  Depression screen PHQ 2/9 01/30/2017  Decreased Interest 2  Down, Depressed, Hopeless 2  PHQ - 2 Score 4  Altered sleeping 3  Tired, decreased energy 2  Change in appetite 3  Feeling bad or failure about yourself  1  Trouble concentrating 2  Moving slowly or fidgety/restless 1  PHQ-9 Score 16   GAD 7 : Generalized Anxiety Score 01/30/2017  Nervous, Anxious, on Edge 1  Control/stop worrying 1  Worry too much - different things 1  Trouble relaxing 2  Restless 0  Easily annoyed or irritable 1  Afraid - awful might happen 2  Total GAD 7 Score 8      Assessment and Plan: 46 y.o. male with   Snoring, Excessive daytime sleepiness - positive STOP/BANG screen, blood pressure out of range today - Split night study ordered  Palpitations, History of congenital heart defect - ECHOCARDIOGRAM LIMITED BUBBLE STUDY; Future - ECG - Palpitations are likely related to patient's current stress at work, but ordering ECHO to r/o valvular disorder. In addition to anxiety, patient is also taking Adderall for ADHD, which could be contributory.  Chronic prostatitis - treating empirically with Cipro - ciprofloxacin (CIPRO) 750 MG tablet; Take 1 tablet (750 mg total) by mouth 2 (two) times daily.  Dispense: 28 tablet; Refill: 0 - Ua, micro, and cx pending - follow-up with Urology  Encounter for preventative adult health care examination - CBC - Comprehensive metabolic panel - Lipid Panel w/reflex Direct LDL - Hemoglobin A1c - PSA, total and free  Benign prostatic hyperplasia with incomplete bladder emptying - AUASS 5 - mild - PSA, total and free  Moderate single current episode of major  depressive disorder (HCC) - PHQ9 score of 16. Patient has a safety plan and does not need referral to ER today - starting SSRI - close follow-up in 1 month - escitalopram (LEXAPRO) 20 MG tablet; One half tab daily for a week then one tab by mouth daily  Dispense: 30 tablet; Refill: 1  Patient education and anticipatory guidance given Patient agrees with treatment plan Follow-up in 1 month or sooner as needed  Levonne Hubert PA-C

## 2017-01-31 LAB — HEMOGLOBIN A1C
HEMOGLOBIN A1C: 5.2 % (ref <5.7)
MEAN PLASMA GLUCOSE: 103 mg/dL

## 2017-01-31 LAB — PSA, TOTAL AND FREE
PSA, % FREE: 40 % (ref >25)
PSA, FREE: 0.4 ng/mL
PSA, TOTAL: 1 ng/mL (ref <=4.0)

## 2017-02-01 LAB — URINE CULTURE: ORGANISM ID, BACTERIA: NO GROWTH

## 2017-02-02 DIAGNOSIS — N411 Chronic prostatitis: Secondary | ICD-10-CM | POA: Insufficient documentation

## 2017-02-02 DIAGNOSIS — N400 Enlarged prostate without lower urinary tract symptoms: Secondary | ICD-10-CM | POA: Insufficient documentation

## 2017-02-02 DIAGNOSIS — R0683 Snoring: Secondary | ICD-10-CM | POA: Insufficient documentation

## 2017-02-02 DIAGNOSIS — R3914 Feeling of incomplete bladder emptying: Secondary | ICD-10-CM

## 2017-02-02 DIAGNOSIS — Z8774 Personal history of (corrected) congenital malformations of heart and circulatory system: Secondary | ICD-10-CM | POA: Insufficient documentation

## 2017-02-02 DIAGNOSIS — F321 Major depressive disorder, single episode, moderate: Secondary | ICD-10-CM | POA: Insufficient documentation

## 2017-02-02 DIAGNOSIS — R002 Palpitations: Secondary | ICD-10-CM | POA: Insufficient documentation

## 2017-02-02 DIAGNOSIS — E782 Mixed hyperlipidemia: Secondary | ICD-10-CM | POA: Insufficient documentation

## 2017-02-02 DIAGNOSIS — N401 Enlarged prostate with lower urinary tract symptoms: Secondary | ICD-10-CM | POA: Insufficient documentation

## 2017-02-02 DIAGNOSIS — G4719 Other hypersomnia: Secondary | ICD-10-CM | POA: Insufficient documentation

## 2017-02-02 MED ORDER — ATORVASTATIN CALCIUM 10 MG PO TABS: 10.0000 mg | ORAL_TABLET | Freq: Every day | ORAL | 3 refills | Status: DC

## 2017-02-10 ENCOUNTER — Telehealth: Payer: Self-pay | Admitting: Physician Assistant

## 2017-02-10 DIAGNOSIS — G4719 Other hypersomnia: Secondary | ICD-10-CM

## 2017-02-10 DIAGNOSIS — R0683 Snoring: Secondary | ICD-10-CM

## 2017-02-10 NOTE — Telephone Encounter (Signed)
Patient's insurance will not cover lab sleep study, but will cover at home sleep study. New order placed with [facility].

## 2017-02-27 ENCOUNTER — Ambulatory Visit: Payer: BLUE CROSS/BLUE SHIELD | Admitting: Physician Assistant

## 2017-03-07 ENCOUNTER — Other Ambulatory Visit (HOSPITAL_COMMUNITY): Payer: BLUE CROSS/BLUE SHIELD

## 2017-03-21 ENCOUNTER — Encounter (HOSPITAL_COMMUNITY): Payer: Self-pay | Admitting: Physician Assistant

## 2017-03-28 ENCOUNTER — Other Ambulatory Visit: Payer: Self-pay | Admitting: Physician Assistant

## 2017-03-28 DIAGNOSIS — F321 Major depressive disorder, single episode, moderate: Secondary | ICD-10-CM

## 2017-04-07 ENCOUNTER — Telehealth: Payer: Self-pay

## 2017-04-07 ENCOUNTER — Encounter: Payer: Self-pay | Admitting: Physician Assistant

## 2017-04-07 DIAGNOSIS — G4733 Obstructive sleep apnea (adult) (pediatric): Secondary | ICD-10-CM | POA: Insufficient documentation

## 2017-04-07 NOTE — Telephone Encounter (Signed)
Left vm for pt to return call to clinic -EH/RMA  

## 2017-04-07 NOTE — Telephone Encounter (Signed)
-----   Message from [facility], [place] sent at 04/07/2017  3:51 PM EDT ----- Sleep study showed severe sleep apnea Recommend that he come in for a follow-up appointment so we can measure his neck circumference and order his CPAP supplies

## 2017-04-21 ENCOUNTER — Other Ambulatory Visit: Payer: Self-pay | Admitting: Physician Assistant

## 2017-04-21 ENCOUNTER — Ambulatory Visit (HOSPITAL_COMMUNITY): Payer: BLUE CROSS/BLUE SHIELD | Attending: Cardiology

## 2017-04-21 ENCOUNTER — Other Ambulatory Visit: Payer: Self-pay

## 2017-04-21 DIAGNOSIS — Z8774 Personal history of (corrected) congenital malformations of heart and circulatory system: Secondary | ICD-10-CM | POA: Diagnosis not present

## 2017-04-21 DIAGNOSIS — R002 Palpitations: Secondary | ICD-10-CM | POA: Diagnosis not present

## 2017-04-27 ENCOUNTER — Other Ambulatory Visit: Payer: Self-pay | Admitting: Physician Assistant

## 2017-04-27 DIAGNOSIS — F321 Major depressive disorder, single episode, moderate: Secondary | ICD-10-CM

## 2017-05-20 ENCOUNTER — Telehealth: Payer: Self-pay | Admitting: Physician Assistant

## 2017-05-20 DIAGNOSIS — G4733 Obstructive sleep apnea (adult) (pediatric): Secondary | ICD-10-CM

## 2017-05-20 NOTE — Telephone Encounter (Signed)
Patient's CPAP/supplies ordered for autotitration 7-20 cm H2O. Order sent to [facility].

## 2017-06-14 ENCOUNTER — Other Ambulatory Visit: Payer: Self-pay | Admitting: Physician Assistant

## 2017-06-14 DIAGNOSIS — F321 Major depressive disorder, single episode, moderate: Secondary | ICD-10-CM

## 2017-07-04 ENCOUNTER — Telehealth: Payer: Self-pay

## 2017-07-04 NOTE — Telephone Encounter (Signed)
Received fax from [facility] stating that after 3 attempts the pt could not be reached to set up CPAP.

## 2018-01-25 ENCOUNTER — Other Ambulatory Visit: Payer: Self-pay | Admitting: Physician Assistant

## 2018-01-25 DIAGNOSIS — E782 Mixed hyperlipidemia: Secondary | ICD-10-CM

## 2018-02-27 ENCOUNTER — Other Ambulatory Visit: Payer: Self-pay | Admitting: Physician Assistant

## 2018-02-27 DIAGNOSIS — E782 Mixed hyperlipidemia: Secondary | ICD-10-CM

## 2018-04-04 ENCOUNTER — Other Ambulatory Visit: Payer: Self-pay | Admitting: Physician Assistant

## 2018-04-04 DIAGNOSIS — E782 Mixed hyperlipidemia: Secondary | ICD-10-CM

## 2020-02-03 ENCOUNTER — Other Ambulatory Visit: Payer: Self-pay

## 2020-02-03 ENCOUNTER — Ambulatory Visit (INDEPENDENT_AMBULATORY_CARE_PROVIDER_SITE_OTHER): Payer: 59 | Admitting: Family Medicine

## 2020-02-03 ENCOUNTER — Encounter: Payer: Self-pay | Admitting: Family Medicine

## 2020-02-03 DIAGNOSIS — G4733 Obstructive sleep apnea (adult) (pediatric): Secondary | ICD-10-CM | POA: Diagnosis not present

## 2020-02-03 DIAGNOSIS — F411 Generalized anxiety disorder: Secondary | ICD-10-CM | POA: Insufficient documentation

## 2020-02-03 DIAGNOSIS — F321 Major depressive disorder, single episode, moderate: Secondary | ICD-10-CM

## 2020-02-03 MED ORDER — ESCITALOPRAM OXALATE 10 MG PO TABS: 10.0000 mg | ORAL_TABLET | Freq: Every day | ORAL | 1 refills | Status: DC

## 2020-02-03 NOTE — Assessment & Plan Note (Signed)
No longer taking atorvastatin Will update lipid profile at upcoming CPE.

## 2020-02-03 NOTE — Assessment & Plan Note (Signed)
Some increased anxiety, will restart lexapro.  F/u in 6 weeks for this and CPE.

## 2020-02-03 NOTE — Progress Notes (Signed)
Hector Chavez - 49 y.o. male MRN [id number]  Date of birth: 1971-07-04  Subjective Chief Complaint  Patient presents with  . Anxiety    HPI Hector Chavez is a 49 y.o. male with history of GAD, HLD and OSA here today to re-establish care.  He reports he is doing well today.  He has previously been prescribed lexapro for his anxiety.  He doesn't think that he ever took this medication and just "put it away in the sock drawer".  He reports some increased stress recently and would be interested in restarting this. He does report significant daytime fatigue.  He previously had sleep study showing severe OSA.  Attempts to arrange for CPAP through [facility] were unsuccessful at the time.  He is interested in pursuing treatment for this.  He is no longer taking lipitor for HLD.  He plans to schedule CPE to have labs updated.    ROS:  A comprehensive ROS was completed and negative except as noted per HPI      Allergies  Allergen Reactions  . Aspirin Other (See Comments)    REACTION: Dizzy, flushed in face    Past Medical History:  Diagnosis Date  . Allergy   . Heart murmur   . Hyperlipidemia     Past Surgical History:  Procedure Laterality Date  . LT knee lateral release  1981  . RT knee lateral release  1994    Social History   Socioeconomic History  . Marital status: Married    Spouse name: Not on file  . Number of children: Not on file  . Years of education: Not on file  . Highest education level: Not on file  Occupational History  . Not on file  Tobacco Use  . Smoking status: Former Smoker    Quit date: 12/24/2003    Years since quitting: 16.1  . Smokeless tobacco: Never Used  Substance and Sexual Activity  . Alcohol use: Yes    Alcohol/week: 6.0 - 10.0 standard drinks    Types: 6 - 10 Standard drinks or equivalent per week    Comment: per weel  . Drug use: No  . Sexual activity: Yes  Other Topics Concern  . Not on file  Social History Narrative   Production manager for [employer], married, 1 daughter   Social Determinants of Health   Financial Resource Strain:   . Difficulty of Paying Living Expenses: Not on file  Food Insecurity:   . Worried About Programme researcher, broadcasting/film/video in the Last Year: Not on file  . Ran Out of Food in the Last Year: Not on file  Transportation Needs:   . Lack of Transportation (Medical): Not on file  . Lack of Transportation (Non-Medical): Not on file  Physical Activity:   . Days of Exercise per Week: Not on file  . Minutes of Exercise per Session: Not on file  Stress:   . Feeling of Stress : Not on file  Social Connections:   . Frequency of Communication with Friends and Family: Not on file  . Frequency of Social Gatherings with Friends and Family: Not on file  . Attends Religious Services: Not on file  . Active Member of Clubs or Organizations: Not on file  . Attends Banker Meetings: Not on file  . Marital Status: Not on file    Family History  Problem Relation Age of Onset  . Dementia Mother 35       (alzheimers)  . Heart attack Father  89  . Hypertension Father   . Brain cancer Father   . Cancer Other        lung/aunt/breast/cousins/mom and dads side  . Depression Other        nephew    Health Maintenance  Topic Date Due  . COLONOSCOPY  04/19/2019  . INFLUENZA VACCINE  07/24/2019  . TETANUS/TDAP  02/02/2021 (Originally 04/18/1988)  . HIV Screening  02/02/2021 (Originally 04/18/1984)    ----------------------------------------------------------------------------------------------------------------------------------------------------------------------------------------------------------------- Physical Exam BP (!) 142/86   Pulse 89   Temp 98.2 F (36.8 C) (Oral)   Ht 5\' 11"  (1.803 m)   Wt 284 lb (128.8 kg)   BMI 39.61 kg/m   Physical Exam Constitutional:      Appearance: Normal appearance.  HENT:     Head: Normocephalic and atraumatic.  Eyes:     General: No scleral icterus. Cardiovascular:      Rate and Rhythm: Normal rate and regular rhythm.  Pulmonary:     Effort: Pulmonary effort is normal.     Breath sounds: Normal breath sounds.  Musculoskeletal:     Cervical back: Neck supple.  Skin:    General: Skin is warm and dry.  Neurological:     General: No focal deficit present.     Mental Status: He is alert.  Psychiatric:        Mood and Affect: Mood normal.        Behavior: Behavior normal.     ------------------------------------------------------------------------------------------------------------------------------------------------------------------------------------------------------------------- Assessment and Plan  Severe obstructive sleep apnea Unable to contact previously to set up autopap.  Will see if we can use previous sleep study to obtain new machine for him.   DYSLIPIDEMIA No longer taking atorvastatin Will update lipid profile at upcoming CPE.   GAD (generalized anxiety disorder) Some increased anxiety, will restart lexapro.  F/u in 6 weeks for this and CPE.     This visit occurred during the SARS-CoV-2 public health emergency.  Safety protocols were in place, including screening questions prior to the visit, additional usage of staff PPE, and extensive cleaning of exam room while observing appropriate contact time as indicated for disinfecting solutions.

## 2020-02-03 NOTE — Patient Instructions (Addendum)
Great to meet you! We'll work on getting you set up with equipment for sleep apnea.   Lets plan to follow up in 6-8 weeks to see how the lexapro is working.  We'll do a physical and fasting labs at that time.

## 2020-02-03 NOTE — Assessment & Plan Note (Signed)
Unable to contact previously to set up autopap.  Will see if we can use previous sleep study to obtain new machine for him.

## 2020-03-23 ENCOUNTER — Other Ambulatory Visit: Payer: Self-pay

## 2020-03-23 ENCOUNTER — Encounter: Payer: Self-pay | Admitting: Family Medicine

## 2020-03-23 ENCOUNTER — Ambulatory Visit (INDEPENDENT_AMBULATORY_CARE_PROVIDER_SITE_OTHER): Payer: 59 | Admitting: Family Medicine

## 2020-03-23 VITALS — BP 138/80 | HR 76 | Ht 71.0 in | Wt 276.0 lb

## 2020-03-23 DIAGNOSIS — Z0000 Encounter for general adult medical examination without abnormal findings: Secondary | ICD-10-CM

## 2020-03-23 DIAGNOSIS — E782 Mixed hyperlipidemia: Secondary | ICD-10-CM | POA: Diagnosis not present

## 2020-03-23 DIAGNOSIS — Z1211 Encounter for screening for malignant neoplasm of colon: Secondary | ICD-10-CM

## 2020-03-23 DIAGNOSIS — G4719 Other hypersomnia: Secondary | ICD-10-CM | POA: Diagnosis not present

## 2020-03-23 DIAGNOSIS — Z125 Encounter for screening for malignant neoplasm of prostate: Secondary | ICD-10-CM | POA: Diagnosis not present

## 2020-03-23 NOTE — Assessment & Plan Note (Signed)
Well adult Orders Placed This Encounter  Procedures  . COMPLETE METABOLIC PANEL WITH GFR  . CBC  . Lipid Profile  . TSH  . PSA  . Cologuard   Screening: Elects to have cologuard for colon cancer screening.  PSA levels ordered Immunization: UTD Anticipatory guidance/Risk factor reduction:  Discussed cutting back on EtOH use some.  Additional recommendations per AVS.

## 2020-03-23 NOTE — Progress Notes (Signed)
Hector Chavez - 49 y.o. male MRN [id number]  Date of birth: 07/29/1971  Subjective No chief complaint on file.   HPI Hector Chavez is a 49 y.o. male here today for annual exam. He reports that he is doing well and denies new concerns today. He has been more consistent about taking his lexapro and feels that this is working well for him.  He has not heard anything about CPAP equipment.    He is a former smoker.  He denies respiratory symptoms.   He does drink 2-3 glasses of red wine nightly.    He does yard work/household chores and has stationary bike for exercise.   He has started following a healthier diet recently.   Review of Systems  Constitutional: Negative for chills, fever, malaise/fatigue and weight loss.  HENT: Negative for congestion, ear pain and sore throat.   Eyes: Negative for blurred vision, double vision and pain.  Respiratory: Negative for cough and shortness of breath.   Cardiovascular: Negative for chest pain and palpitations.  Gastrointestinal: Negative for abdominal pain, blood in stool, constipation, heartburn and nausea.  Genitourinary: Negative for dysuria and urgency.  Musculoskeletal: Negative for joint pain and myalgias.  Neurological: Negative for dizziness and headaches.  Endo/Heme/Allergies: Does not bruise/bleed easily.  Psychiatric/Behavioral: Negative for depression. The patient is not nervous/anxious and does not have insomnia.     Allergies  Allergen Reactions  . Aspirin Other (See Comments)    REACTION: Dizzy, flushed in face    Past Medical History:  Diagnosis Date  . Allergy   . Heart murmur   . Hyperlipidemia     Past Surgical History:  Procedure Laterality Date  . LT knee lateral release  1981  . RT knee lateral release  1994    Social History   Socioeconomic History  . Marital status: Married    Spouse name: Not on file  . Number of children: Not on file  . Years of education: Not on file  . Highest education level: Not  on file  Occupational History  . Not on file  Tobacco Use  . Smoking status: Former Smoker    Quit date: 12/24/2003    Years since quitting: 16.2  . Smokeless tobacco: Never Used  Substance and Sexual Activity  . Alcohol use: Yes    Alcohol/week: 6.0 - 10.0 standard drinks    Types: 6 - 10 Standard drinks or equivalent per week    Comment: per weel  . Drug use: No  . Sexual activity: Yes  Other Topics Concern  . Not on file  Social History Narrative   Banker for [employer], married, 1 daughter   Social Determinants of Health   Financial Resource Strain:   . Difficulty of Paying Living Expenses:   Food Insecurity:   . Worried About Charity fundraiser in the Last Year:   . Arboriculturist in the Last Year:   Transportation Needs:   . Film/video editor (Medical):   Marland Kitchen Lack of Transportation (Non-Medical):   Physical Activity:   . Days of Exercise per Week:   . Minutes of Exercise per Session:   Stress:   . Feeling of Stress :   Social Connections:   . Frequency of Communication with Friends and Family:   . Frequency of Social Gatherings with Friends and Family:   . Attends Religious Services:   . Active Member of Clubs or Organizations:   . Attends Archivist Meetings:   .  Marital Status:     Family History  Problem Relation Age of Onset  . Dementia Mother 43       (alzheimers)  . Heart attack Father 57  . Hypertension Father   . Brain cancer Father   . Cancer Other        lung/aunt/breast/cousins/mom and dads side  . Depression Other        nephew    Health Maintenance  Topic Date Due  . COLONOSCOPY  Never done  . TETANUS/TDAP  02/02/2021 (Originally 04/18/1988)  . HIV Screening  02/02/2021 (Originally 04/18/1984)  . INFLUENZA VACCINE  07/23/2020      ----------------------------------------------------------------------------------------------------------------------------------------------------------------------------------------------------------------- Physical Exam BP 138/80   Pulse 76   Ht 5\' 11"  (1.803 m)   Wt 276 lb (125.2 kg)   BMI 38.49 kg/m   Physical Exam Constitutional:      General: He is not in acute distress. HENT:     Head: Normocephalic and atraumatic.     Right Ear: Tympanic membrane and external ear normal.     Left Ear: Tympanic membrane and external ear normal.  Eyes:     General: No scleral icterus. Neck:     Thyroid: No thyromegaly.  Cardiovascular:     Rate and Rhythm: Normal rate and regular rhythm.     Heart sounds: Normal heart sounds.  Pulmonary:     Effort: Pulmonary effort is normal.     Breath sounds: Normal breath sounds.  Abdominal:     General: Bowel sounds are normal. There is no distension.     Palpations: Abdomen is soft.     Tenderness: There is no abdominal tenderness. There is no guarding.  Musculoskeletal:     Cervical back: Normal range of motion.  Lymphadenopathy:     Cervical: No cervical adenopathy.  Skin:    General: Skin is warm and dry.     Findings: No rash.  Neurological:     General: No focal deficit present.     Mental Status: He is alert and oriented to person, place, and time.     Cranial Nerves: No cranial nerve deficit.     Motor: No abnormal muscle tone.  Psychiatric:        Mood and Affect: Mood normal.        Behavior: Behavior normal.     ------------------------------------------------------------------------------------------------------------------------------------------------------------------------------------------------------------------- Assessment and Plan  Well adult exam Well adult Orders Placed This Encounter  Procedures  . COMPLETE METABOLIC PANEL WITH GFR  . CBC  . Lipid Profile  . TSH  . PSA  . Cologuard   Screening:  Elects to have cologuard for colon cancer screening.  PSA levels ordered Immunization: UTD Anticipatory guidance/Risk factor reduction:  Discussed cutting back on EtOH use some.  Additional recommendations per AVS.     No orders of the defined types were placed in this encounter.   No follow-ups on file.    This visit occurred during the SARS-CoV-2 public health emergency.  Safety protocols were in place, including screening questions prior to the visit, additional usage of staff PPE, and extensive cleaning of exam room while observing appropriate contact time as indicated for disinfecting solutions.

## 2020-03-23 NOTE — Patient Instructions (Signed)
Preventive Care 41-51 Years Old, Male Preventive care refers to lifestyle choices and visits with your health care provider that can promote health and wellness. This includes:  A yearly physical exam. This is also called an annual well check.  Regular dental and eye exams.  Immunizations.  Screening for certain conditions.  Healthy lifestyle choices, such as eating a healthy diet, getting regular exercise, not using drugs or products that contain nicotine and tobacco, and limiting alcohol use. What can I expect for my preventive care visit? Physical exam Your health care provider will check:  Height and weight. These may be used to calculate body mass index (BMI), which is a measurement that tells if you are at a healthy weight.  Heart rate and blood pressure.  Your skin for abnormal spots. Counseling Your health care provider may ask you questions about:  Alcohol, tobacco, and drug use.  Emotional well-being.  Home and relationship well-being.  Sexual activity.  Eating habits.  Work and work Statistician. What immunizations do I need?  Influenza (flu) vaccine  This is recommended every year. Tetanus, diphtheria, and pertussis (Tdap) vaccine  You may need a Td booster every 10 years. Varicella (chickenpox) vaccine  You may need this vaccine if you have not already been vaccinated. Zoster (shingles) vaccine  You may need this after age 64. Measles, mumps, and rubella (MMR) vaccine  You may need at least one dose of MMR if you were born in 1957 or later. You may also need a second dose. Pneumococcal conjugate (PCV13) vaccine  You may need this if you have certain conditions and were not previously vaccinated. Pneumococcal polysaccharide (PPSV23) vaccine  You may need one or two doses if you smoke cigarettes or if you have certain conditions. Meningococcal conjugate (MenACWY) vaccine  You may need this if you have certain conditions. Hepatitis A  vaccine  You may need this if you have certain conditions or if you travel or work in places where you may be exposed to hepatitis A. Hepatitis B vaccine  You may need this if you have certain conditions or if you travel or work in places where you may be exposed to hepatitis B. Haemophilus influenzae type b (Hib) vaccine  You may need this if you have certain risk factors. Human papillomavirus (HPV) vaccine  If recommended by your health care provider, you may need three doses over 6 months. You may receive vaccines as individual doses or as more than one vaccine together in one shot (combination vaccines). Talk with your health care provider about the risks and benefits of combination vaccines. What tests do I need? Blood tests  Lipid and cholesterol levels. These may be checked every 5 years, or more frequently if you are over 60 years old.  Hepatitis C test.  Hepatitis B test. Screening  Lung cancer screening. You may have this screening every year starting at age 51 if you have a 30-pack-year history of smoking and currently smoke or have quit within the past 15 years.  Prostate cancer screening. Recommendations will vary depending on your family history and other risks.  Colorectal cancer screening. All adults should have this screening starting at age 72 and continuing until age 2. Your health care provider may recommend screening at age 14 if you are at increased risk. You will have tests every 1-10 years, depending on your results and the type of screening test.  Diabetes screening. This is done by checking your blood sugar (glucose) after you have not eaten  for a while (fasting). You may have this done every 1-3 years.  Sexually transmitted disease (STD) testing. Follow these instructions at home: Eating and drinking  Eat a diet that includes fresh fruits and vegetables, whole grains, lean protein, and low-fat dairy products.  Take vitamin and mineral supplements as  recommended by your health care provider.  Do not drink alcohol if your health care provider tells you not to drink.  If you drink alcohol: ? Limit how much you have to 0-2 drinks a day. ? Be aware of how much alcohol is in your drink. In the [place] one drink equals one 12 oz bottle of beer (355 mL), one 5 oz glass of wine (148 mL), or one 1 oz glass of hard liquor (44 mL). Lifestyle  Take daily care of your teeth and gums.  Stay active. Exercise for at least 30 minutes on 5 or more days each week.  Do not use any products that contain nicotine or tobacco, such as cigarettes, e-cigarettes, and chewing tobacco. If you need help quitting, ask your health care provider.  If you are sexually active, practice safe sex. Use a condom or other form of protection to prevent STIs (sexually transmitted infections).  Talk with your health care provider about taking a low-dose aspirin every day starting at age 51. What's next?  Go to your health care provider once a year for a well check visit.  Ask your health care provider how often you should have your eyes and teeth checked.  Stay up to date on all vaccines. This information is not intended to replace advice given to you by your health care provider. Make sure you discuss any questions you have with your health care provider. Document Revised: 12/03/2018 Document Reviewed: 12/03/2018 Elsevier Patient Education  2020 [employer].

## 2020-03-24 LAB — CBC
HCT: 53 % — ABNORMAL HIGH (ref 38.5–50.0)
Hemoglobin: 17.7 g/dL — ABNORMAL HIGH (ref 13.2–17.1)
MCH: 30.6 pg (ref 27.0–33.0)
MCHC: 33.4 g/dL (ref 32.0–36.0)
MCV: 91.5 fL (ref 80.0–100.0)
MPV: 10.7 fL (ref 7.5–12.5)
Platelets: 236 10*3/uL (ref 140–400)
RBC: 5.79 10*6/uL (ref 4.20–5.80)
RDW: 13.1 % (ref 11.0–15.0)
WBC: 6.6 10*3/uL (ref 3.8–10.8)

## 2020-03-24 LAB — COMPLETE METABOLIC PANEL WITH GFR
AG Ratio: 1.4 (calc) (ref 1.0–2.5)
ALT: 51 U/L — ABNORMAL HIGH (ref 9–46)
AST: 35 U/L (ref 10–35)
Albumin: 4.5 g/dL (ref 3.6–5.1)
Alkaline phosphatase (APISO): 68 U/L (ref 35–144)
BUN: 11 mg/dL (ref 7–25)
CO2: 26 mmol/L (ref 20–32)
Calcium: 9.7 mg/dL (ref 8.6–10.3)
Chloride: 102 mmol/L (ref 98–110)
Creat: 1.11 mg/dL (ref 0.70–1.33)
GFR, Est African American: 89 mL/min/{1.73_m2} (ref >=60)
GFR, Est Non African American: 77 mL/min/{1.73_m2} (ref >=60)
Globulin: 3.2 g/dL (calc) (ref 1.9–3.7)
Glucose, Bld: 101 mg/dL — ABNORMAL HIGH (ref 65–99)
Potassium: 4.5 mmol/L (ref 3.5–5.3)
Sodium: 140 mmol/L (ref 135–146)
Total Bilirubin: 0.8 mg/dL (ref 0.2–1.2)
Total Protein: 7.7 g/dL (ref 6.1–8.1)

## 2020-03-24 LAB — LIPID PANEL
Cholesterol: 282 mg/dL — ABNORMAL HIGH (ref <200)
HDL: 47 mg/dL (ref >=40)
LDL Cholesterol (Calc): 195 mg/dL (calc) — ABNORMAL HIGH
Non-HDL Cholesterol (Calc): 235 mg/dL (calc) — ABNORMAL HIGH (ref <130)
Total CHOL/HDL Ratio: 6 (calc) — ABNORMAL HIGH (ref <5.0)
Triglycerides: 210 mg/dL — ABNORMAL HIGH (ref <150)

## 2020-03-24 LAB — TSH: TSH: 1.68 mIU/L (ref 0.40–4.50)

## 2020-03-24 LAB — PSA: PSA: 0.8 ng/mL (ref <=4.0)

## 2020-04-09 ENCOUNTER — Encounter: Payer: Self-pay | Admitting: Family Medicine

## 2020-04-10 ENCOUNTER — Other Ambulatory Visit: Payer: Self-pay | Admitting: Family Medicine

## 2020-04-10 DIAGNOSIS — E782 Mixed hyperlipidemia: Secondary | ICD-10-CM

## 2020-04-10 MED ORDER — ATORVASTATIN CALCIUM 10 MG PO TABS: 10.0000 mg | ORAL_TABLET | Freq: Every day | ORAL | 3 refills | Status: DC

## 2020-04-10 NOTE — Progress Notes (Signed)
Pt has seen results on MyChart and patient has responded on mychart to PCP with any changes.

## 2020-04-30 LAB — COLOGUARD
COLOGUARD: NEGATIVE
Cologuard: NEGATIVE

## 2020-06-07 ENCOUNTER — Encounter: Payer: Self-pay | Admitting: Family Medicine

## 2020-07-05 ENCOUNTER — Ambulatory Visit (INDEPENDENT_AMBULATORY_CARE_PROVIDER_SITE_OTHER): Payer: No Typology Code available for payment source | Admitting: Sports Medicine

## 2020-07-05 ENCOUNTER — Ambulatory Visit (INDEPENDENT_AMBULATORY_CARE_PROVIDER_SITE_OTHER): Payer: No Typology Code available for payment source

## 2020-07-05 DIAGNOSIS — M19041 Primary osteoarthritis, right hand: Secondary | ICD-10-CM

## 2020-07-05 DIAGNOSIS — M7661 Achilles tendinitis, right leg: Secondary | ICD-10-CM

## 2020-07-05 MED ORDER — NITROGLYCERIN 0.2 MG/HR TD PT24: MEDICATED_PATCH | TRANSDERMAL | 11 refills | Status: DC

## 2020-07-05 NOTE — Assessment & Plan Note (Signed)
Tenderness at the DIP, Bouchard and Heberden nodes, minimal pain, this is well controlled with an occasional ibuprofen or Aleve. Adding x-rays, hand rehab exercises, return as needed for this.

## 2020-07-05 NOTE — Progress Notes (Signed)
    Procedures performed today:    None.  Independent interpretation of notes and tests performed by another provider:   None.  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 No Achilles nodules, negative Thompson's test. We are going to start conservatively, topical nitroglycerin, custom orthotics, heel lifts, formal physical therapy with eccentric rehabilitation, return to see me in 6 weeks, injection of the retrocalcaneal bursa if no better.  Primary osteoarthritis of right hand Tenderness at the DIP, Bouchard and Heberden nodes, minimal pain, this is well controlled with an occasional ibuprofen or Aleve. Adding x-rays, hand rehab exercises, return as needed for this.    ___________________________________________ Ihor Austin. Benjamin Stain, M.D., ABFM., CAQSM. Primary Care and Sports Medicine [facility]  Adjunct Instructor of Family Medicine  [employer]

## 2020-07-05 NOTE — Assessment & Plan Note (Addendum)
This is a very pleasant 49 year old male, for some time now he has had pain in his right heel, at the Achilles insertion, worse with prolonged weightbearing. No Achilles nodules, negative Thompson's test. We are going to start conservatively, topical nitroglycerin, custom orthotics, heel lifts, formal physical therapy with eccentric rehabilitation, return to see me in 6 weeks, injection of the retrocalcaneal bursa if no better.

## 2020-07-20 ENCOUNTER — Other Ambulatory Visit: Payer: Self-pay

## 2020-07-20 ENCOUNTER — Encounter: Payer: Self-pay | Admitting: Physical Therapy

## 2020-07-20 ENCOUNTER — Ambulatory Visit (INDEPENDENT_AMBULATORY_CARE_PROVIDER_SITE_OTHER): Payer: No Typology Code available for payment source | Admitting: Physical Therapy

## 2020-07-20 DIAGNOSIS — M25671 Stiffness of right ankle, not elsewhere classified: Secondary | ICD-10-CM

## 2020-07-20 DIAGNOSIS — M25541 Pain in joints of right hand: Secondary | ICD-10-CM

## 2020-07-20 DIAGNOSIS — M25571 Pain in right ankle and joints of right foot: Secondary | ICD-10-CM

## 2020-07-20 NOTE — Patient Instructions (Signed)
Access Code: [email or web address] URL: [email or web address] Date: 07/20/2020 Prepared by: Roderic Scarce  Exercises Putty Squeezes - 10 reps Resisted Finger Extension and Thumb Abduction - 10 reps Standing Bilateral Heel Raise on Step - 1 x daily - 2 sets - 10 reps Seated Ankle Eversion with Resistance - 1 x daily - 2 sets - 10 reps

## 2020-07-20 NOTE — Therapy (Signed)
[facility] [place] [address] [place], [place], [address] Phone: [phone]   Fax:  [phone]  Physical Therapy Evaluation  Patient Details  Name: Hector Chavez MRN: [id number] Date of Birth: [birth year] Referring Provider (PT): Dr Benjamin Stain   Encounter Date: 07/20/2020   PT End of Session - 07/20/20 1407    Visit Number 1    Number of Visits 12    Date for PT Re-Evaluation 08/31/20    Authorization Type aetna    Authorization Time Period ionto not covered    PT Start Time 1407    PT Stop Time 1452    PT Time Calculation (min) 45 min    Activity Tolerance Patient tolerated treatment well    Behavior During Therapy [facility] for tasks assessed/performed           Past Medical History:  Diagnosis Date  . Allergy   . Heart murmur   . Hyperlipidemia     Past Surgical History:  Procedure Laterality Date  . LT knee lateral release  1981  . RT knee lateral release  1994    There were no vitals filed for this visit.    Subjective Assessment - 07/20/20 1407    Subjective Pt reports that his Rt hand has bothered him for a long time and it swells up when he uses it alot - most difficulty is in the distal end of fingers.  The Rt ankle started giving him trouble about a year ago and it felt like a hot poker.  He would notice it with inceased activity at first, now he feels it daily. It has been recommended that he get orthotics and a heel lift.    Pertinent History bilat knee scopes    Patient Stated Goals get rid of the pain in the ankle, learn how to manage the hand pain    Currently in Pain? Yes    Pain Score 4     Pain Location Ankle   achilles tendon   Pain Orientation Right    Pain Descriptors / Indicators Burning;Sharp;Shooting    Pain Type Chronic pain    Pain Onset More than a month ago    Pain Frequency Constant    Aggravating Factors  walking,    Pain Relieving Factors has tried ice              [facility]  PT Assessment - 07/20/20 0001      Assessment   Medical Diagnosis Rt achilles tendonitis and Rt hand OA    Referring Provider (PT) Dr Benjamin Stain    Onset Date/Surgical Date 07/21/19    Hand Dominance Right    Next MD Visit 08/15/2020    Prior Therapy for knees and Rt wrist - fx      Precautions   Precautions None      Balance Screen   Has the patient fallen in the past 6 months No    Has the patient had a decrease in activity level because of a fear of falling?  No    Is the patient reluctant to leave their home because of a fear of falling?  No      Home Environment   Living Environment Private residence    Living Arrangements Spouse/significant other    Home Layout Two level      Prior Function   Level of Independence Independent    Vocation Full time employment    Scientist, water quality    Leisure yard  work, golf, reading and gamiing      ROM / Strength   AROM / PROM / Strength AROM;Strength      AROM   AROM Assessment Site Shoulder;Elbow;Finger;Ankle    Right/Left Shoulder --   WNL   Right/Left Elbow --   WNL   Right/Left Finger --   WNL   Right/Left Ankle Right;Left    Right Ankle Dorsiflexion 11    Right Ankle Plantar Flexion --   WNL   Right Ankle Inversion 35    Right Ankle Eversion 32    Left Ankle Dorsiflexion 10    Left Ankle Inversion 40    Left Ankle Eversion 40      Strength   Strength Assessment Site Shoulder;Elbow;Hand;Hip;Knee;Ankle;Wrist    Right/Left Shoulder --   5/5   Right/Left Elbow --   5/5   Right/Left Wrist --   Rt 4+/5, Lt 5/5   Right/Left hand Right;Left    Right Hand Grip (lbs) 110/110/100    Left Hand Grip (lbs) 120/120/120    Right/Left Hip --   5/5   Right/Left Knee --   5/5   Right/Left Ankle --   grossly 5/5, except Rt PF 4+/5     Palpation   Palpation comment tenderness at achilles insertion site, minimal hypomobility in calcaneous and talus. Good joint mobility in rt fingers.                        Objective measurements completed on examination: See above findings.       [facility] Adult PT Treatment/Exercise - 07/20/20 0001      Self-Care   Self-Care Other Self-Care Comments    Other Self-Care Comments  issued heel lift for Rt shoe - to use once the tape comes off, informed pt to watch for back pain and if that occurs to stop using lift.       Exercises   Exercises Other Exercises    Other Exercises  Rt hand reviewed grip, finger extension with band resistance and finger waves, eccentric lowering with heel raises, bilat ankle eversion with green band seated      Manual Therapy   Manual Therapy Taping    Kinesiotex Inhibit Muscle      Kinesiotix   Inhibit Muscle  I strip on Rt achilles/gastroc with cross I strip at achilles insertion to create space.                    PT Education - 07/20/20 1642    Education Details HEP and POC    Person(s) Educated Patient    Methods Explanation;Demonstration;Handout    Comprehension Returned demonstration;Verbalized understanding               PT Long Term Goals - 07/20/20 1648      PT LONG TERM GOAL #1   Title I with advanced HEP ( 08/31/2020)    Time 6    Period Weeks    Status New    Target Date 08/31/20      PT LONG TERM GOAL #2   Title increase Rt grip =/> 115# ( 08/31/2020)    Time 6    Period Weeks    Status New    Target Date 08/31/20      PT LONG TERM GOAL #3   Title improve Rt ankle DF =/> 15 degrees ( 08/31/2020)    Time 6    Period Weeks    Status New  Target Date 08/31/20      PT LONG TERM GOAL #4   Title report =/> 75% reduction in rt achillies pain with daily activity ( 08/31/2020)    Time 6    Period Weeks    Status New    Target Date 08/31/20                  Plan - 07/20/20 1643    Clinical Impression Statement 49 yo male with long h/o rt hand pain and swelling that occurs when he overuses it.  He has good joint mobility at this time, some weakness as  compared to lt hand.  He was issued ther ex to work on this.  Rt achilles has been bothering him for about a year and is beginning to be more consistent.  He is point tender to palpation, slight weakness in plantar flexion and the tendon area is slightly swollen.  He has minimal loss of motion into dorsiflexion.  Responded well to taping and issued a heel lift PRN.  He would benefit from PT to address his defecits and restore PLOF    Personal Factors and Comorbidities Comorbidity 2    Examination-Activity Limitations Squat;Lift    Examination-Participation Restrictions Community Activity;Other;Yard Work    Stability/Clinical Decision Making Stable/Uncomplicated    Clinical Decision Making Low    Rehab Potential Excellent    PT Frequency 2x / week    PT Duration 6 weeks    PT Treatment/Interventions Ultrasound;Neuromuscular re-education;Compression bandaging;Patient/family education;Dry needling;Cryotherapy;Functional mobility training;Electrical Stimulation;Moist Heat;Therapeutic exercise;Balance training;Manual techniques;Taping;Vasopneumatic Device    PT Next Visit Plan teach taping if it was helpful, eccentric ankle work, strengthening for hand, no ionto - aetna does not pay for it.    PT Home Exercise Plan Access Code: [email or web address]    Consulted and Agree with Plan of Care Patient           Patient will benefit from skilled therapeutic intervention in order to improve the following deficits and impairments:  Pain, Increased edema, Impaired UE functional use, Decreased range of motion, Decreased strength  Visit Diagnosis: Pain in right ankle and joints of right foot - Plan: PT plan of care cert/re-cert  Pain in joints of right hand - Plan: PT plan of care cert/re-cert  Stiffness of right ankle, not elsewhere classified - Plan: PT plan of care cert/re-cert     Problem List Patient Active Problem List   Diagnosis Date Noted  . Insertional right Achilles tendinosis with Haglund's  deformity 07/05/2020  . Primary osteoarthritis of right hand 07/05/2020  . Well adult exam 03/23/2020  . GAD (generalized anxiety disorder) 02/03/2020  . Severe obstructive sleep apnea 04/07/2017  . Snoring 02/02/2017  . Palpitations 02/02/2017  . Excessive daytime sleepiness 02/02/2017  . Hyperlipidemia 06/28/2011  . DYSLIPIDEMIA 07/27/2009  . ALLERGIC RHINITIS 07/27/2009  . CARDIAC MURMUR 07/27/2009    Roderic Scarce PT  07/20/2020, 4:51 PM  [facility] [address] [place], [place], [address] Phone: [phone]   Fax:  [phone]  Name: Prentice Sackrider MRN: [id number] Date of Birth: Jun 16, 1971

## 2020-07-27 ENCOUNTER — Encounter: Payer: Self-pay | Admitting: Rehabilitative and Restorative Service Providers"

## 2020-07-27 ENCOUNTER — Ambulatory Visit (INDEPENDENT_AMBULATORY_CARE_PROVIDER_SITE_OTHER): Payer: No Typology Code available for payment source | Admitting: Rehabilitative and Restorative Service Providers"

## 2020-07-27 ENCOUNTER — Other Ambulatory Visit: Payer: Self-pay

## 2020-07-27 DIAGNOSIS — M25571 Pain in right ankle and joints of right foot: Secondary | ICD-10-CM

## 2020-07-27 DIAGNOSIS — M25541 Pain in joints of right hand: Secondary | ICD-10-CM

## 2020-07-27 DIAGNOSIS — M25671 Stiffness of right ankle, not elsewhere classified: Secondary | ICD-10-CM

## 2020-07-27 NOTE — Therapy (Signed)
[facility] [place] [address] [place], [place], [address] Phone: [phone]   Fax:  [phone]  Physical Therapy Treatment  Patient Details  Name: Hector Chavez MRN: [id number] Date of Birth: 1971-08-10 Referring Provider (PT): Dr Benjamin Stain   Encounter Date: 07/27/2020   PT End of Session - 07/27/20 1452    Visit Number 2    Number of Visits 12    Date for PT Re-Evaluation 08/31/20    Authorization Type aetna    Authorization Time Period ionto not covered    PT Start Time 1446    PT Stop Time 1525    PT Time Calculation (min) 39 min    Activity Tolerance Patient tolerated treatment well           Past Medical History:  Diagnosis Date  . Allergy   . Heart murmur   . Hyperlipidemia     Past Surgical History:  Procedure Laterality Date  . LT knee lateral release  1981  . RT knee lateral release  1994    There were no vitals filed for this visit.   Subjective Assessment - 07/27/20 1452    Subjective Patient reports that he has had a few episodes over the past few days that the ankle was really burning but then it resolved. Overall he has some ups and downs moving in the right direction. He is working on his exercises at home.    Currently in Pain? Yes    Pain Score 3     Pain Location Ankle    Pain Orientation Right    Pain Descriptors / Indicators Sharp    Pain Type Chronic pain                             [facility] Adult PT Treatment/Exercise - 07/27/20 0001      Exercises   Other Exercises  Rt hand reviewed grip, finger extension with band resistance and finger waves, bilat ankle eversion with green band seated      Manual Therapy   Manual Therapy Taping    Kinesiotex Inhibit Muscle      Kinesiotix   Inhibit Muscle  I strip on Rt achilles/gastroc with 2 strips across at achilles insertion to create space.        Ankle Exercises: Aerobic   Nustep L6 x 5 min UE 10       Ankle  Exercises: Stretches   Soleus Stretch 3 reps;30 seconds    Gastroc Stretch 3 reps;30 seconds      Ankle Exercises: Standing   Vector Stance Right   10 reps x 2-3 sec    SLS 30 sec x 3 each side; repeated with head turns 30 sec x 2 reps each side     Heel Raises Both;10 reps;3 seconds   2 sets    Heel Raises Limitations eccentric lowering     Toe Raise Limitations --                       PT Long Term Goals - 07/20/20 1648      PT LONG TERM GOAL #1   Title I with advanced HEP ( 08/31/2020)    Time 6    Period Weeks    Status New    Target Date 08/31/20      PT LONG TERM GOAL #2   Title increase Rt grip =/> 115# ( 08/31/2020)  Time 6    Period Weeks    Status New    Target Date 08/31/20      PT LONG TERM GOAL #3   Title improve Rt ankle DF =/> 15 degrees ( 08/31/2020)    Time 6    Period Weeks    Status New    Target Date 08/31/20      PT LONG TERM GOAL #4   Title report =/> 75% reduction in rt achillies pain with daily activity ( 08/31/2020)    Time 6    Period Weeks    Status New    Target Date 08/31/20                 Plan - 07/27/20 1455    Clinical Impression Statement Positive response to initial treatment with decreased pain and tightness reported in the ankle and the Rt finger. pt is working on his exercises at home. Added dynamic balance activities today without difficulty.    Rehab Potential Excellent    PT Frequency 2x / week    PT Duration 6 weeks    PT Treatment/Interventions Ultrasound;Neuromuscular re-education;Compression bandaging;Patient/family education;Dry needling;Cryotherapy;Functional mobility training;Electrical Stimulation;Moist Heat;Therapeutic exercise;Balance training;Manual techniques;Taping;Vasopneumatic Device    PT Next Visit Plan teach taping if it was helpful, eccentric ankle work, strengthening for hand, no ionto - [employer] does not pay for it.    PT Home Exercise Plan [email or web address]    Consulted and Agree with Plan of Care  Patient           Patient will benefit from skilled therapeutic intervention in order to improve the following deficits and impairments:     Visit Diagnosis: Pain in right ankle and joints of right foot  Pain in joints of right hand  Stiffness of right ankle, not elsewhere classified     Problem List Patient Active Problem List   Diagnosis Date Noted  . 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 Primary osteoarthritis of right hand 07/05/2020  . Well adult exam 03/23/2020  . GAD (generalized anxiety disorder) 02/03/2020  . Severe obstructive sleep apnea 04/07/2017  . Snoring 02/02/2017  . Palpitations 02/02/2017  . Excessive daytime sleepiness 02/02/2017  . Hyperlipidemia 06/28/2011  . DYSLIPIDEMIA 07/27/2009  . ALLERGIC RHINITIS 07/27/2009  . CARDIAC MURMUR 07/27/2009    Hector Chavez PT, MPH  07/27/2020, 3:26 PM  [facility] [address] [place], [place], [address] Phone: [phone]   Fax:  [phone]  Name: Hector Chavez MRN: [id number] Date of Birth: 05/01/1971

## 2020-07-30 ENCOUNTER — Other Ambulatory Visit: Payer: Self-pay | Admitting: Family Medicine

## 2020-07-30 DIAGNOSIS — F321 Major depressive disorder, single episode, moderate: Secondary | ICD-10-CM

## 2020-07-31 ENCOUNTER — Other Ambulatory Visit: Payer: Self-pay

## 2020-07-31 ENCOUNTER — Ambulatory Visit (INDEPENDENT_AMBULATORY_CARE_PROVIDER_SITE_OTHER): Payer: No Typology Code available for payment source | Admitting: Physical Therapy

## 2020-07-31 DIAGNOSIS — M25671 Stiffness of right ankle, not elsewhere classified: Secondary | ICD-10-CM

## 2020-07-31 DIAGNOSIS — M25571 Pain in right ankle and joints of right foot: Secondary | ICD-10-CM

## 2020-07-31 DIAGNOSIS — M25541 Pain in joints of right hand: Secondary | ICD-10-CM

## 2020-07-31 NOTE — Therapy (Signed)
[place], [place], [address] Phone: [phone]   Fax:  [phone]  Physical Therapy Treatment  Patient Details  Name: Hector Chavez MRN: [id number] Date of Birth: October 05, 1971 Referring Provider (PT): Dr Dianah Field   Encounter Date: 07/31/2020   PT End of Session - 07/31/20 1652    Visit Number 3    Number of Visits 12    Date for PT Re-Evaluation 08/31/20    Authorization Type aetna    Authorization Time Period ionto not covered    PT Start Time 1603    PT Stop Time 1641    PT Time Calculation (min) 38 min    Activity Tolerance Patient tolerated treatment well    Behavior During Therapy [facility] for tasks assessed/performed           Past Medical History:  Diagnosis Date  . Allergy   . Heart murmur   . Hyperlipidemia     Past Surgical History:  Procedure Laterality Date  . LT knee lateral release  1981  . RT knee lateral release  1994    There were no vitals filed for this visit.   Subjective Assessment - 07/31/20 1606    Subjective Pt reports some improvement in frequency of pain in Rt ankle, and strength in hand and ankle.  He noticed he wasn't limping yesterday after helping her daughter into dorm.    Patient Stated Goals get rid of the pain in the ankle, learn how to manage the hand pain    Currently in Pain? No/denies              [facility] PT Assessment - 07/31/20 0001      Assessment   Medical Diagnosis Rt achilles tendonitis and Rt hand OA    Referring Provider (PT) Dr Dianah Field    Onset Date/Surgical Date 07/21/19    Hand Dominance Right    Next MD Visit 08/15/2020    Prior Therapy for knees and Rt wrist - fx      Strength   Right Hand Grip (lbs) 121    Left Hand Grip (lbs) 118           [facility] Adult PT Treatment/Exercise - 07/31/20 0001      Self-Care   Other Self-Care Comments  pt educated on self IASTM to calf/ achilles; pt verbalized understanding and  returned brief demo.       Exercises   Other Exercises  Pt issued stress pill - shown exercises of opposition and finger/thumb adduction with it, in addition to gripping.       Hand Exercises   Other Hand Exercises Rt thumb stretch into ext and abduction;  Rt wrist extensor stretch x 10-15 sec      Manual Therapy   Manual Therapy Soft tissue mobilization;Taping    Soft tissue mobilization IASTM to Rt calf and tissue surrounding Achilles tendon to decrease fascial restrictions and improve ROM.       Kinesiotix   Inhibit Muscle  I strip of reg Rock tape applied from plantar surface of foot up to Rt achilles/gastroc with 2 strips across at achilles insertion to create space.        Ankle Exercises: Stretches   Plantar Fascia Stretch 1 rep;20 seconds    Soleus Stretch 3 reps;20 seconds   heel of step with knee flexed   Slant Board Stretch 3 reps;30 seconds    Other Stretch half kneeling dorsi flexion stretch x 15 sec  PT Long Term Goals - 07/31/20 1610      PT LONG TERM GOAL #1   Title I with advanced HEP ( 08/31/2020)    Time 6    Period Weeks    Status On-going      PT LONG TERM GOAL #2   Title increase Rt grip =/> 115# ( 08/31/2020)    Time 6    Period Weeks    Status Achieved      PT LONG TERM GOAL #3   Title improve Rt ankle DF =/> 15 degrees ( 08/31/2020)    Time 6    Period Weeks    Status On-going      PT LONG TERM GOAL #4   Title report =/> 75% reduction in Rt achillies pain with daily activity ( 08/31/2020)    Baseline 30% reduction    Time 6    Period Weeks    Status Partially Met                 Plan - 07/31/20 1649    Clinical Impression Statement Pt demonstrated improved Rt hand grip strength; has met LTG#2.   Modified soleus stretch with good response.  Good improvement in symptoms since initiating therapy.    Rehab Potential Excellent    PT Frequency 2x / week    PT Duration 6 weeks    PT Treatment/Interventions  Ultrasound;Neuromuscular re-education;Compression bandaging;Patient/family education;Dry needling;Cryotherapy;Functional mobility training;Electrical Stimulation;Moist Heat;Therapeutic exercise;Balance training;Manual techniques;Taping;Vasopneumatic Device    PT Next Visit Plan assess response to IASTM.  eccentric ankle work.    PT Home Exercise Plan [email or web address]    Consulted and Agree with Plan of Care Patient           Patient will benefit from skilled therapeutic intervention in order to improve the following deficits and impairments:     Visit Diagnosis: Pain in right ankle and joints of right foot  Pain in joints of right hand  Stiffness of right ankle, not elsewhere classified     Problem List Patient Active Problem List   Diagnosis Date Noted  . 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 Primary osteoarthritis of right hand 07/05/2020  . Well adult exam 03/23/2020  . GAD (generalized anxiety disorder) 02/03/2020  . Severe obstructive sleep apnea 04/07/2017  . Snoring 02/02/2017  . Palpitations 02/02/2017  . Excessive daytime sleepiness 02/02/2017  . Hyperlipidemia 06/28/2011  . DYSLIPIDEMIA 07/27/2009  . ALLERGIC RHINITIS 07/27/2009  . CARDIAC MURMUR 07/27/2009   Kerin Perna, PTA 07/31/20 4:54 PM  [place], [place], [address] Phone: [phone]   Fax:  [phone]  Name: Eliazer Hemphill MRN: [id number] Date of Birth: Feb 13, 1971

## 2020-08-03 ENCOUNTER — Other Ambulatory Visit: Payer: Self-pay

## 2020-08-03 ENCOUNTER — Ambulatory Visit (INDEPENDENT_AMBULATORY_CARE_PROVIDER_SITE_OTHER): Payer: No Typology Code available for payment source | Admitting: Rehabilitative and Restorative Service Providers"

## 2020-08-03 ENCOUNTER — Encounter: Payer: Self-pay | Admitting: Rehabilitative and Restorative Service Providers"

## 2020-08-03 DIAGNOSIS — M25571 Pain in right ankle and joints of right foot: Secondary | ICD-10-CM | POA: Diagnosis not present

## 2020-08-03 DIAGNOSIS — M25671 Stiffness of right ankle, not elsewhere classified: Secondary | ICD-10-CM

## 2020-08-03 DIAGNOSIS — M25541 Pain in joints of right hand: Secondary | ICD-10-CM

## 2020-08-03 NOTE — Patient Instructions (Signed)
Access Code: [email or web address] URL: [email or web address] Date: 08/03/2020 Prepared by: Margretta Ditty  Exercises Putty Squeezes - 10 reps Resisted Finger Extension and Thumb Abduction - 10 reps Seated Ankle Eversion with Resistance - 1 x daily - 2 sets - 10 reps Seated Hamstring Stretch with Chair - 2 x daily - 7 x weekly - 1 sets - 2 reps - 30 seconds hold Standing Bilateral Heel Raise on Step - 1 x daily - 2 sets - 10 reps Single Leg Balance with Clock Reach - 2 x daily - 7 x weekly - 1 sets - 5 reps Forward T - 2 x daily - 7 x weekly - 1-2 sets - 10 reps - 2-3 sec hold Standing Soleus Stretch on Step - 1 x daily - 7 x weekly - 3 sets - 2 reps - 15-30 hold Gastroc Stretch on Wall - 2 x daily - 7 x weekly - 1 sets - 3 reps - 30 sec hold

## 2020-08-03 NOTE — Therapy (Signed)
[place], [place], [address] Phone: [phone]   Fax:  [phone]  Physical Therapy Treatment  Patient Details  Name: Hector Chavez MRN: [id number] Date of Birth: 17-Nov-1971 Referring Provider (PT): Dr Dianah Field   Encounter Date: 08/03/2020   PT End of Session - 08/03/20 1603    Visit Number 4    Number of Visits 12    Date for PT Re-Evaluation 08/31/20    Authorization Type aetna    Authorization Time Period ionto not covered    PT Start Time 1532    PT Stop Time 1612    PT Time Calculation (min) 40 min    Activity Tolerance Patient tolerated treatment well    Behavior During Therapy [facility] for tasks assessed/performed           Past Medical History:  Diagnosis Date  . Allergy   . Heart murmur   . Hyperlipidemia     Past Surgical History:  Procedure Laterality Date  . LT knee lateral release  1981  . RT knee lateral release  1994    There were no vitals filed for this visit.   Subjective Assessment - 08/03/20 1533    Subjective Ther ex for home increases ankle pain.  Overall, ankle pain is improved and hand is also improved.    Patient Stated Goals get rid of the pain in the ankle, learn how to manage the hand pain    Currently in Pain? No/denies              [facility] PT Assessment - 08/03/20 1541      Assessment   Medical Diagnosis Rt achilles tendonitis and Rt hand OA    Referring Provider (PT) Dr Dianah Field    Onset Date/Surgical Date 07/21/19                         [facility] Adult PT Treatment/Exercise - 08/03/20 1541      Ambulation/Gait   Ambulation/Gait Yes    Stairs Yes    Stairs Assistance 7: Independent    Pre-Gait Activities 2 flights of stair negotiation without heel pain    Gait Comments treadmill x 3 minutes at 2.0 mph with 3% incline.  Mild knowledge of R lateral heel, no pain      Exercises   Exercises Ankle;Knee/Hip      Knee/Hip  Exercises: Stretches   Active Hamstring Stretch Right;Left;1 rep;30 seconds      Knee/Hip Exercises: Standing   Step Down Left;10 reps    Step Down Limitations on compliant surfaces      Ankle Exercises: Stretches   Soleus Stretch 1 rep;30 seconds    Gastroc Stretch 2 reps;30 seconds    Slant Board Stretch 1 rep;60 seconds      Ankle Exercises: Standing   Vector Stance Right    Vector Stance Limitations clock reaches on solid surface and on foam    Heel Raises Right;10 reps    Heel Raises Limitations notes mild sensation of burning R achilles that stops immediately after completing exercise                  PT Education - 08/03/20 1602    Education Details HEP    Person(s) Educated Patient    Methods Explanation;Demonstration;Handout    Comprehension Returned demonstration;Verbalized understanding               PT Long Term Goals - 07/31/20  [place] #1   Title I with advanced HEP ( 08/31/2020)    Time 6    Period Weeks    Status On-going      PT LONG TERM GOAL #2   Title increase Rt grip =/> 115# ( 08/31/2020)    Time 6    Period Weeks    Status Achieved      PT LONG TERM GOAL #3   Title improve Rt ankle DF =/> 15 degrees ( 08/31/2020)    Time 6    Period Weeks    Status On-going      PT LONG TERM GOAL #4   Title report =/> 75% reduction in Rt achillies pain with daily activity ( 08/31/2020)    Baseline 30% reduction    Time 6    Period Weeks    Status Partially Met                 Plan - 08/03/20 1611    Clinical Impression Statement The patient is making progress with achilles pain and finger pain.  PT recommended patient return to walking without tape to determine tolerance to activity.  PT to check LTGs next week.    Rehab Potential Excellent    PT Frequency 2x / week    PT Duration 6 weeks    PT Treatment/Interventions Ultrasound;Neuromuscular re-education;Compression bandaging;Patient/family education;Dry  needling;Cryotherapy;Functional mobility training;Electrical Stimulation;Moist Heat;Therapeutic exercise;Balance training;Manual techniques;Taping;Vasopneumatic Device    PT Next Visit Plan Follow up about shoewear at home, how long could he walk before pain begins without tape?, assess response to IASTM.  eccentric ankle work.  progress HEP to single leg heel raise    PT Home Exercise Plan [email or web address]    Consulted and Agree with Plan of Care Patient           Patient will benefit from skilled therapeutic intervention in order to improve the following deficits and impairments:     Visit Diagnosis: Pain in right ankle and joints of right foot  Pain in joints of right hand  Stiffness of right ankle, not elsewhere classified     Problem List Patient Active Problem List   Diagnosis Date Noted  . 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 Primary osteoarthritis of right hand 07/05/2020  . Well adult exam 03/23/2020  . GAD (generalized anxiety disorder) 02/03/2020  . Severe obstructive sleep apnea 04/07/2017  . Snoring 02/02/2017  . Palpitations 02/02/2017  . Excessive daytime sleepiness 02/02/2017  . Hyperlipidemia 06/28/2011  . DYSLIPIDEMIA 07/27/2009  . ALLERGIC RHINITIS 07/27/2009  . CARDIAC MURMUR 07/27/2009    [place], PT 08/03/2020, 10:09 PM  [facility] [place], [place], [address] Phone: [phone]   Fax:  [phone]  Name: Hector Chavez MRN: [id number] Date of Birth: 1971-12-14

## 2020-08-07 ENCOUNTER — Encounter: Payer: No Typology Code available for payment source | Admitting: Physical Therapy

## 2020-08-10 ENCOUNTER — Other Ambulatory Visit: Payer: Self-pay

## 2020-08-10 ENCOUNTER — Ambulatory Visit (INDEPENDENT_AMBULATORY_CARE_PROVIDER_SITE_OTHER): Payer: No Typology Code available for payment source | Admitting: Physical Therapy

## 2020-08-10 DIAGNOSIS — M25671 Stiffness of right ankle, not elsewhere classified: Secondary | ICD-10-CM | POA: Diagnosis not present

## 2020-08-10 DIAGNOSIS — M25571 Pain in right ankle and joints of right foot: Secondary | ICD-10-CM | POA: Diagnosis not present

## 2020-08-10 DIAGNOSIS — M25541 Pain in joints of right hand: Secondary | ICD-10-CM

## 2020-08-10 NOTE — Therapy (Signed)
[place], [place], [address] Phone: [phone]   Fax:  [phone]  Physical Therapy Treatment  Patient Details  Name: Hector Chavez MRN: [id number] Date of Birth: 07/17/1971 Referring Provider (PT): Dr Dianah Field   Encounter Date: 08/10/2020   PT End of Session - 08/10/20 1712    Visit Number 5    Number of Visits 12    Date for PT Re-Evaluation 08/31/20    Authorization Type aetna    Authorization Time Period ionto not covered    PT Start Time 1618    PT Stop Time 1653    PT Time Calculation (min) 35 min    Activity Tolerance Patient tolerated treatment well    Behavior During Therapy [facility] for tasks assessed/performed           Past Medical History:  Diagnosis Date  . Allergy   . Heart murmur   . Hyperlipidemia     Past Surgical History:  Procedure Laterality Date  . LT knee lateral release  1981  . RT knee lateral release  1994    There were no vitals filed for this visit.   Subjective Assessment - 08/10/20 1621    Subjective Pt reports work as been busy so he hasn't been as diligent with exercise.  Continued stiffness in Rt ankle in AM, resolves with walking around.  hand is 100% better, ankle is 60% improved.    Patient Stated Goals get rid of the pain in the ankle, learn how to manage the hand pain    Currently in Pain? No/denies    Pain Score 0-No pain              [facility] PT Assessment - 08/10/20 0001      Assessment   Medical Diagnosis Rt achilles tendonitis and Rt hand OA    Referring Provider (PT) Dr Dianah Field    Onset Date/Surgical Date 07/21/19    Next MD Visit 08/15/20      AROM   Right Ankle Dorsiflexion 15            [facility] Adult PT Treatment/Exercise - 08/10/20 0001      Manual Therapy   Soft tissue mobilization IASTM to Rt calf and tissue surrounding Achilles tendon to decrease fascial restrictions and improve ROM.       Kinesiotix   Inhibit  Muscle  I strip of reg Rock tape applied from plantar surface of foot up to Rt achilles/gastroc with 2 strips across at achilles insertion to create space.        Ankle Exercises: Standing   Heel Walk (Round Trip) 16   8 Lt/Rt    Toe Walk (Round Trip) 16   8 Lt/ Rt     Ankle Exercises: Aerobic   Recumbent Bike L2: 4 min for warm up      Ankle Exercises: Stretches   Soleus Stretch 2 reps;30 seconds    Gastroc Stretch 2 reps;30 seconds    Other Stretch hamstring stretch with foot on 12" step to stretch fascial chain x 30 sec x 2 reps RLE, 1 rep LLE.             PT Long Term Goals - 08/10/20 1704      PT LONG TERM GOAL #1   Title I with advanced HEP ( 08/31/2020)    Time 6    Period Weeks    Status On-going      PT LONG TERM GOAL #2  Title increase Rt grip =/> 115# ( 08/31/2020)    Time 6    Period Weeks    Status Achieved      PT LONG TERM GOAL #3   Title improve Rt ankle DF =/> 15 degrees ( 08/31/2020)    Time 6    Period Weeks    Status Achieved      PT LONG TERM GOAL #4   Title report =/> 75% reduction in Rt achillies pain with daily activity ( 08/31/2020)    Baseline 60% improvement    Time 6    Period Weeks    Status Partially Met                 Plan - 08/10/20 1705    Clinical Impression Statement Pt demonstrated improved Rt ankle DF; has met ROM goal.  Pt reporting overall improvement of Rt heel pain. Pt tolerated all exercises well; pt shown variation on stretch to improve compliance. Discussed crocks as not a good supportive house shoe; may want to wear sneakers instead.  Encouraged pt to be more compliant with HEP to assist in meeting goals.    Rehab Potential Excellent    PT Frequency 2x / week    PT Duration 6 weeks    PT Treatment/Interventions Ultrasound;Neuromuscular re-education;Compression bandaging;Patient/family education;Dry needling;Cryotherapy;Functional mobility training;Electrical Stimulation;Moist Heat;Therapeutic exercise;Balance  training;Manual techniques;Taping;Vasopneumatic Device    PT Next Visit Plan eccentric ankle work.  progress HEP to single leg heel raise. IASTM/ tape as indicated.    PT Home Exercise Plan [email or web address]    Consulted and Agree with Plan of Care Patient           Patient will benefit from skilled therapeutic intervention in order to improve the following deficits and impairments:  Pain, Increased edema, Impaired UE functional use, Decreased range of motion, Decreased strength  Visit Diagnosis: Pain in right ankle and joints of right foot  Pain in joints of right hand  Stiffness of right ankle, not elsewhere classified     Problem List Patient Active Problem List   Diagnosis Date Noted  . 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 Primary osteoarthritis of right hand 07/05/2020  . Well adult exam 03/23/2020  . GAD (generalized anxiety disorder) 02/03/2020  . Severe obstructive sleep apnea 04/07/2017  . Snoring 02/02/2017  . Palpitations 02/02/2017  . Excessive daytime sleepiness 02/02/2017  . Hyperlipidemia 06/28/2011  . DYSLIPIDEMIA 07/27/2009  . ALLERGIC RHINITIS 07/27/2009  . CARDIAC MURMUR 07/27/2009   Kerin Perna, PTA 08/10/20 5:21 PM  [place], [place], [address] Phone: [phone]   Fax:  [phone]  Name: Hector Chavez MRN: [id number] Date of Birth: 1971-04-22

## 2020-08-15 ENCOUNTER — Ambulatory Visit: Payer: No Typology Code available for payment source | Admitting: Sports Medicine

## 2020-08-17 ENCOUNTER — Ambulatory Visit (INDEPENDENT_AMBULATORY_CARE_PROVIDER_SITE_OTHER): Payer: No Typology Code available for payment source | Admitting: Physical Therapy

## 2020-08-17 ENCOUNTER — Other Ambulatory Visit: Payer: Self-pay

## 2020-08-17 DIAGNOSIS — M25571 Pain in right ankle and joints of right foot: Secondary | ICD-10-CM | POA: Diagnosis not present

## 2020-08-17 DIAGNOSIS — M25541 Pain in joints of right hand: Secondary | ICD-10-CM

## 2020-08-17 DIAGNOSIS — M25671 Stiffness of right ankle, not elsewhere classified: Secondary | ICD-10-CM

## 2020-08-17 NOTE — Therapy (Addendum)
[place] [address], [place], [address] Phone: [phone]   Fax:  [phone]  Physical Therapy Treatment/Discharge  Patient Details  Name: Hector Chavez MRN: [id number] Date of Birth: 1971-08-15 Referring Provider (PT): Dr Dianah Field   Encounter Date: 08/17/2020   PT End of Session - 08/17/20 0855    Visit Number 6    Number of Visits 12    Date for PT Re-Evaluation 08/31/20    Authorization Type aetna    Authorization Time Period ionto not covered    PT Start Time 0851    PT Stop Time 0926    PT Time Calculation (min) 35 min    Activity Tolerance Patient tolerated treatment well    Behavior During Therapy [facility] for tasks assessed/performed           Past Medical History:  Diagnosis Date  . Allergy   . Heart murmur   . Hyperlipidemia     Past Surgical History:  Procedure Laterality Date  . LT knee lateral release  1981  . RT knee lateral release  1994    There were no vitals filed for this visit.   Subjective Assessment - 08/17/20 0855    Subjective Pt walked the neighborhood Tue without pain, but yesterday he had pain in ankle 1/2 way through walk. Up to 6/10.  Wasn't able to stop and stretch; pain resolved with rest later in evening.  Pt reports that he hit his ankle on desk earlier in day.    Patient Stated Goals get rid of the pain in the ankle, learn how to manage the hand pain    Currently in Pain? No/denies    Pain Score 0-No pain              [facility] PT Assessment - 08/17/20 0001      Assessment   Medical Diagnosis Rt achilles tendonitis and Rt hand OA    Referring Provider (PT) Dr Dianah Field    Onset Date/Surgical Date 07/21/19    Next MD Visit to be scheduled            [facility] Adult PT Treatment/Exercise - 08/17/20 0001      Self-Care   Other Self-Care Comments  pt educated on self IASTM to Rt calf/achilles - pt returned demo with cues.       Knee/Hip Exercises:  Aerobic   Elliptical L1-2: 3 min for warm up.       Knee/Hip Exercises: Standing   Step Down Left;1 set;10 reps;Step Height: 6"    SLS single leg forward leans to touch chair (not touching foot in between reps) x 5 reps each side.    some burning reporting in Rt calf after completion.      Manual Therapy   Soft tissue mobilization IASTM to Rt calf and tissue surrounding Achilles tendon to decrease fascial restrictions and improve ROM.       Kinesiotix   Inhibit Muscle  I strip of reg Rock tape applied from plantar surface of Rt heel up to Rt achilles/gastroc with 2 strips across at achilles insertion to create space.        Ankle Exercises: Standing   SLS 30 sec x 2 reps each leg, on blue pad    Heel Walk (Round Trip) 16   8 Lt/Rt    Toe Walk (Round Trip) 16   8 Lt/ Rt     Ankle Exercises: Stretches   Soleus Stretch 2 reps;30 seconds  Gastroc Stretch 2 reps;30 seconds    Other Stretch hamstring stretch with foot on 12" step to stretch fascial chain x 30 sec x 2 reps RLE, 1 rep LLE.                   PT Education - 08/17/20 1128    Education Details IASTM, reviewed HEP    Person(s) Educated Patient    Methods Explanation    Comprehension Verbalized understanding               PT Long Term Goals - 08/10/20 1704      PT LONG TERM GOAL #1   Title I with advanced HEP ( 08/31/2020)    Time 6    Period Weeks    Status On-going      PT LONG TERM GOAL #2   Title increase Rt grip =/> 115# ( 08/31/2020)    Time 6    Period Weeks    Status Achieved      PT LONG TERM GOAL #3   Title improve Rt ankle DF =/> 15 degrees ( 08/31/2020)    Time 6    Period Weeks    Status Achieved      PT LONG TERM GOAL #4   Title report =/> 75% reduction in Rt achillies pain with daily activity ( 08/31/2020)    Baseline 60% improvement    Time 6    Period Weeks    Status Partially Met                 Plan - 08/17/20 0926    Clinical Impression Statement Pt challenged with Rt  SLS, forward leans and resisted ankle eversion. Banding and tightness noted in distal lateral gastroc; improved with IASTM/STM. Encouraged consistant compliance with HEP, along with self care (rolling calf, ice to Achilles).  Pt now reporting reduced frequency of pain.  Progressing towards goals.    Rehab Potential Excellent    PT Frequency 2x / week    PT Duration 6 weeks    PT Treatment/Interventions Ultrasound;Neuromuscular re-education;Compression bandaging;Patient/family education;Dry needling;Cryotherapy;Functional mobility training;Electrical Stimulation;Moist Heat;Therapeutic exercise;Balance training;Manual techniques;Taping;Vasopneumatic Device    PT Next Visit Plan eccentric ankle work.  progress HEP to single leg heel raise. IASTM/ tape as indicated.    PT Home Exercise Plan [id number]    Consulted and Agree with Plan of Care Patient           Patient will benefit from skilled therapeutic intervention in order to improve the following deficits and impairments:  Pain, Increased edema, Impaired UE functional use, Decreased range of motion, Decreased strength  Visit Diagnosis: Pain in right ankle and joints of right foot  Pain in joints of right hand  Stiffness of right ankle, not elsewhere classified     Problem List Patient Active Problem List   Diagnosis Date Noted  . 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 Primary osteoarthritis of right hand 07/05/2020  . Well adult exam 03/23/2020  . GAD (generalized anxiety disorder) 02/03/2020  . Severe obstructive sleep apnea 04/07/2017  . Snoring 02/02/2017  . Palpitations 02/02/2017  . Excessive daytime sleepiness 02/02/2017  . Hyperlipidemia 06/28/2011  . DYSLIPIDEMIA 07/27/2009  . ALLERGIC RHINITIS 07/27/2009  . CARDIAC MURMUR 07/27/2009   Kerin Perna, PTA 08/17/20 11:30 AM  [place], [place], [address] Phone: [phone]   Fax:  [phone]  Name: Hector Chavez MRN: [id number] Date of  Birth: 02/12/1971   PHYSICAL THERAPY DISCHARGE SUMMARY  Visits from Start of Care: 6  Current functional level related to goals / functional outcomes: See above for last known functional status   Remaining deficits: Unknow, pt has not returned for therapy   Education / Equipment: HEP  Plan:                                                    Patient goals were partially met. Patient is being discharged due to not returning since the last visit.  ?????    Jeral Pinch, PT 10/05/20 10:21 AM

## 2020-08-22 ENCOUNTER — Other Ambulatory Visit: Payer: Self-pay | Admitting: Osteopathic Medicine

## 2020-08-22 DIAGNOSIS — F321 Major depressive disorder, single episode, moderate: Secondary | ICD-10-CM

## 2020-08-22 NOTE — Telephone Encounter (Signed)
Last refill 07/31/2020 Last Ov- 03/26/2020

## 2020-08-24 ENCOUNTER — Encounter: Payer: No Typology Code available for payment source | Admitting: Physical Therapy

## 2020-08-31 ENCOUNTER — Encounter: Payer: No Typology Code available for payment source | Admitting: Rehabilitative and Restorative Service Providers"

## 2021-02-23 ENCOUNTER — Other Ambulatory Visit: Payer: Self-pay | Admitting: Family Medicine

## 2021-02-23 DIAGNOSIS — F321 Major depressive disorder, single episode, moderate: Secondary | ICD-10-CM

## 2021-05-04 ENCOUNTER — Other Ambulatory Visit: Payer: Self-pay | Admitting: Family Medicine

## 2021-05-04 DIAGNOSIS — E782 Mixed hyperlipidemia: Secondary | ICD-10-CM

## 2021-05-04 NOTE — Telephone Encounter (Signed)
Sending 30 day refill of medication.   Patient needs to schedule appointment. Last labs and office visit with Dr. Ashley Royalty was 03/2020.  Please call to schedule. Thanks

## 2021-05-04 NOTE — Telephone Encounter (Signed)
Pt stated that he has to get his schedule and he will call back and make an appt. tvt

## 2021-05-17 ENCOUNTER — Ambulatory Visit: Payer: No Typology Code available for payment source | Admitting: Family Medicine

## 2021-05-17 ENCOUNTER — Other Ambulatory Visit: Payer: Self-pay

## 2021-05-17 ENCOUNTER — Encounter: Payer: Self-pay | Admitting: Family Medicine

## 2021-05-17 VITALS — BP 157/99 | HR 68 | Temp 97.7°F | Ht 71.46 in | Wt 292.9 lb

## 2021-05-17 DIAGNOSIS — R2231 Localized swelling, mass and lump, right upper limb: Secondary | ICD-10-CM

## 2021-05-17 DIAGNOSIS — E7800 Pure hypercholesterolemia, unspecified: Secondary | ICD-10-CM | POA: Diagnosis not present

## 2021-05-17 DIAGNOSIS — M25541 Pain in joints of right hand: Secondary | ICD-10-CM

## 2021-05-17 DIAGNOSIS — R361 Hematospermia: Secondary | ICD-10-CM | POA: Diagnosis not present

## 2021-05-17 DIAGNOSIS — F324 Major depressive disorder, single episode, in partial remission: Secondary | ICD-10-CM

## 2021-05-17 DIAGNOSIS — M25542 Pain in joints of left hand: Secondary | ICD-10-CM

## 2021-05-17 DIAGNOSIS — F329 Major depressive disorder, single episode, unspecified: Secondary | ICD-10-CM | POA: Insufficient documentation

## 2021-05-17 DIAGNOSIS — G4733 Obstructive sleep apnea (adult) (pediatric): Secondary | ICD-10-CM

## 2021-05-17 MED ORDER — BUPROPION HCL ER (XL) 150 MG PO TB24: 150.0000 mg | ORAL_TABLET | Freq: Every day | ORAL | 0 refills | Status: DC

## 2021-05-17 NOTE — Assessment & Plan Note (Signed)
He is using CPAP nightly.

## 2021-05-17 NOTE — Patient Instructions (Addendum)
Great to see you! Have labs completed Let's try bupropion for depression.  I have entered new urology referral.

## 2021-05-17 NOTE — Progress Notes (Signed)
Hector Chavez - 50 y.o. male MRN [id number]  Date of birth: 03-21-1971  Subjective Chief Complaint  Patient presents with  . Arthritis    HPI Hector Chavez is a 50 y.o. male here today for follow up visit.  He has history of HLD and depression.  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  He was seen by urology previously but his urologist left before completing work up and he never returned to see anyone else.   He continues on atorvastatin for management of HLD.  He is tolerating this well and denies myalgias.   He has stopped lexapro because he felt it may have been contributing to weight gain.  He still feels like he has some depression and would be open to trying something different.  He had tried seeing a therapist previously but didn't find this beneficial.   He was diagnosed and treated for ADD in the past as well which he found help with his mood as well.    ROS:  A comprehensive ROS was completed and negative except as noted per HPI  Allergies  Allergen Reactions  . Aspirin Other (See Comments)    REACTION: Dizzy, flushed in face    Past Medical History:  Diagnosis Date  . Allergy   . Heart murmur   . Hyperlipidemia     Past Surgical History:  Procedure Laterality Date  . LT knee lateral release  1981  . RT knee lateral release  1994    Social History   Socioeconomic History  . Marital status: Married    Spouse name: Not on file  . Number of children: Not on file  . Years of education: Not on file  . Highest education level: Not on file  Occupational History  . Not on file  Tobacco Use  . Smoking status: Former Smoker    Quit date: 12/24/2003    Years since quitting: 17.4  . Smokeless tobacco: Never Used  Substance and Sexual Activity  . Alcohol use: Yes    Alcohol/week: 6.0 - 10.0 standard drinks    Types: 6 - 10 Standard drinks or equivalent per week    Comment: per weel  . Drug use: No  . Sexual activity: Yes  Other Topics Concern  . Not  on file  Social History Narrative   Production manager for [employer], married, 1 daughter   Social Determinants of Health   Financial Resource Strain: Not on file  Food Insecurity: Not on file  Transportation Needs: Not on file  Physical Activity: Not on file  Stress: Not on file  Social Connections: Not on file    Family History  Problem Relation Age of Onset  . Dementia Mother 12       (alzheimers)  . Heart attack Father 110  . Hypertension Father   . Brain cancer Father   . Cancer Other        lung/aunt/breast/cousins/mom and dads side  . Depression Other        nephew    Health Maintenance  Topic Date Due  . HIV Screening  Never done  . Hepatitis C Screening  Never done  . TETANUS/TDAP  Never done  . Zoster Vaccines- Shingrix (1 of 2) Never done  . INFLUENZA VACCINE  07/23/2021  . Fecal DNA (Cologuard)  04/25/2023  . COVID-19 Vaccine  Completed  . HPV VACCINES  Aged Out     ----------------------------------------------------------------------------------------------------------------------------------------------------------------------------------------------------------------- Physical Exam BP (!) 157/99 (BP Location: Left Arm, Patient Position: Sitting,  Cuff Size: Large)   Pulse 68   Temp 97.7 F (36.5 C)   Ht 5' 11.46" (1.815 m)   Wt 292 lb 14.4 oz (132.9 kg)   SpO2 100%   BMI 40.33 kg/m   Physical Exam Constitutional:      Appearance: Normal appearance.  HENT:     Head: Normocephalic and atraumatic.  Cardiovascular:     Rate and Rhythm: Regular rhythm.  Pulmonary:     Effort: Pulmonary effort is normal.  Musculoskeletal:     Cervical back: Neck supple.  Skin:    Comments: Nodules on fingers, non tender.   Neurological:     General: No focal deficit present.  Psychiatric:        Mood and Affect: Mood normal.        Behavior: Behavior normal.      ------------------------------------------------------------------------------------------------------------------------------------------------------------------------------------------------------------------- Assessment and Plan  Nodule of finger of right hand Nodules have a tophaceous appearance.  Checking uric acid level.   Severe obstructive sleep apnea He is using CPAP nightly.   Hyperlipidemia Tolerating atorvastatin well. Continue at current strength and update lipid panel.   MDD (major depressive disorder) Starting bupropion for management of depression to replace lexapro as this should be more weight neutral.    Meds ordered this encounter  Medications  . buPROPion (WELLBUTRIN XL) 150 MG 24 hr tablet    Sig: Take 1 tablet (150 mg total) by mouth daily.    Dispense:  90 tablet    Refill:  0    Return in about 6 weeks (around 06/28/2021) for MDD- Ok for virtual if he prefers.    This visit occurred during the SARS-CoV-2 public health emergency.  Safety protocols were in place, including screening questions prior to the visit, additional usage of staff PPE, and extensive cleaning of exam room while observing appropriate contact time as indicated for disinfecting solutions.

## 2021-05-17 NOTE — Assessment & Plan Note (Signed)
Starting bupropion for management of depression to replace lexapro as this should be more weight neutral.

## 2021-05-17 NOTE — Assessment & Plan Note (Signed)
Tolerating atorvastatin well. Continue at current strength and update lipid panel.

## 2021-05-17 NOTE — Assessment & Plan Note (Signed)
Nodules have a tophaceous appearance.  Checking uric acid level.

## 2021-05-18 ENCOUNTER — Other Ambulatory Visit: Payer: Self-pay | Admitting: Family Medicine

## 2021-05-18 DIAGNOSIS — E782 Mixed hyperlipidemia: Secondary | ICD-10-CM

## 2021-05-18 LAB — CBC WITH DIFFERENTIAL/PLATELET
Absolute Monocytes: 497 cells/uL (ref 200–950)
Basophils Absolute: 28 cells/uL (ref 0–200)
Basophils Relative: 0.4 %
Eosinophils Absolute: 200 cells/uL (ref 15–500)
Eosinophils Relative: 2.9 %
HCT: 49.7 % (ref 38.5–50.0)
Hemoglobin: 16.6 g/dL (ref 13.2–17.1)
Lymphs Abs: 2160 cells/uL (ref 850–3900)
MCH: 30.5 pg (ref 27.0–33.0)
MCHC: 33.4 g/dL (ref 32.0–36.0)
MCV: 91.2 fL (ref 80.0–100.0)
MPV: 9.9 fL (ref 7.5–12.5)
Monocytes Relative: 7.2 %
Neutro Abs: 4016 cells/uL (ref 1500–7800)
Neutrophils Relative %: 58.2 %
Platelets: 246 10*3/uL (ref 140–400)
RBC: 5.45 10*6/uL (ref 4.20–5.80)
RDW: 12.9 % (ref 11.0–15.0)
Total Lymphocyte: 31.3 %
WBC: 6.9 10*3/uL (ref 3.8–10.8)

## 2021-05-18 LAB — COMPLETE METABOLIC PANEL WITH GFR
AG Ratio: 1.5 (calc) (ref 1.0–2.5)
ALT: 65 U/L — ABNORMAL HIGH (ref 9–46)
AST: 44 U/L — ABNORMAL HIGH (ref 10–35)
Albumin: 4.6 g/dL (ref 3.6–5.1)
Alkaline phosphatase (APISO): 70 U/L (ref 35–144)
BUN: 10 mg/dL (ref 7–25)
CO2: 28 mmol/L (ref 20–32)
Calcium: 9.9 mg/dL (ref 8.6–10.3)
Chloride: 103 mmol/L (ref 98–110)
Creat: 1.09 mg/dL (ref 0.70–1.33)
GFR, Est African American: 90 mL/min/{1.73_m2} (ref >=60)
GFR, Est Non African American: 78 mL/min/{1.73_m2} (ref >=60)
Globulin: 3 g/dL (calc) (ref 1.9–3.7)
Glucose, Bld: 98 mg/dL (ref 65–139)
Potassium: 4.2 mmol/L (ref 3.5–5.3)
Sodium: 141 mmol/L (ref 135–146)
Total Bilirubin: 0.6 mg/dL (ref 0.2–1.2)
Total Protein: 7.6 g/dL (ref 6.1–8.1)

## 2021-05-18 LAB — PSA: PSA: 0.61 ng/mL (ref <=4.00)

## 2021-05-18 LAB — LIPID PANEL W/REFLEX DIRECT LDL
Cholesterol: 221 mg/dL — ABNORMAL HIGH (ref <200)
HDL: 44 mg/dL (ref >=40)
LDL Cholesterol (Calc): 124 mg/dL (calc) — ABNORMAL HIGH
Non-HDL Cholesterol (Calc): 177 mg/dL (calc) — ABNORMAL HIGH (ref <130)
Total CHOL/HDL Ratio: 5 (calc) — ABNORMAL HIGH (ref <5.0)
Triglycerides: 368 mg/dL — ABNORMAL HIGH (ref <150)

## 2021-05-18 LAB — URIC ACID: Uric Acid, Serum: 7.5 mg/dL (ref 4.0–8.0)

## 2021-05-18 MED ORDER — ATORVASTATIN CALCIUM 20 MG PO TABS: 20.0000 mg | ORAL_TABLET | Freq: Every day | ORAL | 3 refills | Status: DC

## 2021-05-31 ENCOUNTER — Other Ambulatory Visit: Payer: Self-pay | Admitting: Family Medicine

## 2021-05-31 DIAGNOSIS — E782 Mixed hyperlipidemia: Secondary | ICD-10-CM

## 2021-06-25 ENCOUNTER — Emergency Department
Admit: 2021-06-25 | Discharge status: Absent without leave | Payer: No Typology Code available for payment source | Source: Home / Self Care

## 2021-06-25 ENCOUNTER — Other Ambulatory Visit: Payer: Self-pay

## 2021-06-25 ENCOUNTER — Emergency Department
Admission: EM | Admit: 2021-06-25 | Discharge: 2021-06-25 | Disposition: A | Discharge status: Home / Self Care | Payer: No Typology Code available for payment source | Source: Home / Self Care | Attending: Family Medicine | Admitting: Family Medicine

## 2021-06-25 DIAGNOSIS — L2082 Flexural eczema: Secondary | ICD-10-CM

## 2021-06-25 DIAGNOSIS — M654 Radial styloid tenosynovitis [de Quervain]: Secondary | ICD-10-CM | POA: Diagnosis not present

## 2021-06-25 HISTORY — DX: Depression, unspecified: F32.A

## 2021-06-25 MED ORDER — PREDNISONE 10 MG (21) PO TBPK: ORAL_TABLET | Freq: Every day | ORAL | 0 refills | Status: DC

## 2021-06-25 MED ORDER — TRIAMCINOLONE ACETONIDE 0.1 % EX CREA: 1.0000 "application " | TOPICAL_CREAM | Freq: Two times a day (BID) | CUTANEOUS | 0 refills | Status: DC

## 2021-06-25 NOTE — ED Provider Notes (Signed)
[facility]    CSN: [id number] Arrival date & time: 06/25/21  1026      History   Chief Complaint Chief Complaint  Patient presents with   Wrist Pain    Left     HPI Hector Chavez is a 50 y.o. male.   HPI Wrist pain for no apparent reason.  No overuse.  No injury.  He states that he did move some luggage the day before it started.  He describes to me that it hurts to flex his thumb and move his wrist.  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 07/05/2020   Primary osteoarthritis of right hand 07/05/2020   Well adult exam 03/23/2020   GAD (generalized anxiety disorder) 02/03/2020   Severe obstructive sleep apnea 04/07/2017   Snoring 02/02/2017   Palpitations 02/02/2017   Excessive daytime sleepiness 02/02/2017   Hyperlipidemia 06/28/2011   DYSLIPIDEMIA 07/27/2009   ALLERGIC RHINITIS 07/27/2009   CARDIAC MURMUR 07/27/2009    Past Surgical History:  Procedure Laterality Date   LT knee lateral release  1981   RT knee lateral release  1994       Home Medications    Prior to Admission medications   Medication Sig Start Date End Date Taking? Authorizing Provider  predniSONE (STERAPRED UNI-PAK 21 TAB) 10 MG (21) TBPK tablet Take by mouth daily. Take 6 tabs by mouth daily  for 2 days, then 5 tabs for 2 days, then 4 tabs for 2 days, then 3 tabs for 2 days, 2 tabs for 2 days, then 1 tab by mouth daily for 2 days 06/25/21  Yes Eustace Moore, MD  triamcinolone cream (KENALOG) 0.1 % Apply 1 application topically 2 (two) times daily. 06/25/21  Yes Eustace Moore, MD  atorvastatin (LIPITOR) 20 MG  tablet Take 1 tablet (20 mg total) by mouth daily. 05/18/21   Everrett Coombe, DO  buPROPion (WELLBUTRIN XL) 150 MG 24 hr tablet Take 1 tablet (150 mg total) by mouth daily. 05/17/21   Everrett Coombe, DO    Family History Family History  Problem Relation Age of Onset   Dementia Mother 33       (alzheimers)   Heart attack Father 93   Hypertension Father    Brain cancer Father    Cancer Other        lung/aunt/breast/cousins/mom and dads side   Depression Other        nephew    Social History Social History   Tobacco Use   Smoking status: Former    Pack years: 0.00    Types: Cigarettes    Quit date: 12/24/2003    Years since quitting: 17.5   Smokeless tobacco: Never  Vaping Use   Vaping Use: Never used  Substance Use Topics   Alcohol use: Yes    Alcohol/week: 21.0 standard drinks    Types: 21 Standard drinks or equivalent per week    Comment: per week (3 per day)   Drug use: No     Allergies   Aspirin   Review of Systems Review of Systems   Physical  Exam Triage Vital Signs ED Triage Vitals  Enc Vitals Group     BP 06/25/21 1056 (!) 146/91     Pulse Rate 06/25/21 1056 62     Resp 06/25/21 1056 20     Temp 06/25/21 1056 98.5 F (36.9 C)     Temp src --      SpO2 06/25/21 1056 98 %     Weight 06/25/21 1053 290 lb (131.5 kg)     Height 06/25/21 1053 5\' 11"  (1.803 m)     Head Circumference --      Peak Flow --      Pain Score 06/25/21 1053 5     Pain Loc --      Pain Edu? --      Excl. in GC? --    No data found.  Updated Vital Signs BP (!) 146/91   Pulse 62   Temp 98.5 F (36.9 C)   Resp 20   Ht 5\' 11"  (1.803 m)   Wt 131.5 kg   SpO2 98%   BMI 40.45 kg/m   Visual Acuity Right Eye Distance:   Left Eye Distance:   Bilateral Distance:    Right Eye Near:   Left Eye Near:    Bilateral Near:     Physical Exam Constitutional:      General: He is not in acute distress.    Appearance: He is well-developed.  HENT:     Head: Normocephalic and  atraumatic.  Eyes:     Conjunctiva/sclera: Conjunctivae normal.     Pupils: Pupils are equal, round, and reactive to light.  Cardiovascular:     Rate and Rhythm: Normal rate.  Pulmonary:     Effort: Pulmonary effort is normal. No respiratory distress.  Abdominal:     General: There is no distension.     Palpations: Abdomen is soft.  Musculoskeletal:        General: Normal range of motion.     Left hand: Tenderness present. No swelling or deformity.     Cervical back: Normal range of motion.     Comments: Positive Finkelstein test.  Pain with ulnar deviation of the wrist  Skin:    General: Skin is warm and dry.  Neurological:     Mental Status: He is alert.     UC Treatments / Results  Labs (all labs ordered are listed, but only abnormal results are displayed) Labs Reviewed - No data to display  EKG   Radiology No results found.  Procedures Procedures (including critical care time)  Medications Ordered in UC Medications - No data to display  Initial Impression / Assessment and Plan / UC Course  I have reviewed the triage vital signs and the nursing notes.  Pertinent labs & imaging results that were available during my care of the patient were reviewed by me and considered in my medical decision making (see chart for details).     Discussed de Quervain's tenosynovitis.  Treatment.  Reasons for return Final Clinical Impressions(s) / UC Diagnoses   Final diagnoses:  Tendinitis, de Quervain's  Flexural eczema     Discharge Instructions      Wear brace to reduce movement of thumb and wrist Put ice on the area for 20 minutes every couple of hours Take prednisone as directed.  Take all of day 1 today Use the triamcinolone cream on the reddened areas of eczema Follow-up with your primary care if not improved in a week   ED Prescriptions  Medication Sig Dispense Auth. Provider   predniSONE (STERAPRED UNI-PAK 21 TAB) 10 MG (21) TBPK tablet Take by mouth  daily. Take 6 tabs by mouth daily  for 2 days, then 5 tabs for 2 days, then 4 tabs for 2 days, then 3 tabs for 2 days, 2 tabs for 2 days, then 1 tab by mouth daily for 2 days 42 tablet Eustace Moore, MD   triamcinolone cream (KENALOG) 0.1 % Apply 1 application topically 2 (two) times daily. 30 g Eustace Moore, MD      PDMP not reviewed this encounter.   Eustace Moore, MD 06/25/21 1452

## 2021-06-25 NOTE — Discharge Instructions (Addendum)
Wear brace to reduce movement of thumb and wrist Put ice on the area for 20 minutes every couple of hours Take prednisone as directed.  Take all of day 1 today Use the triamcinolone cream on the reddened areas of eczema Follow-up with your primary care if not improved in a week

## 2021-06-25 NOTE — ED Triage Notes (Signed)
Pt presents to Urgent Care with c/o L wrist pain x 2 days. Does not recall injury, but states he was handling luggage around the time of pain onset. Pt also reports hx of L wrist fracture--no surgery. Limited ROM of L wrist. Red patches of skin to area, but pt reports severe eczema.

## 2021-06-28 ENCOUNTER — Ambulatory Visit: Payer: No Typology Code available for payment source | Admitting: Family Medicine

## 2021-06-28 ENCOUNTER — Other Ambulatory Visit: Payer: Self-pay

## 2021-06-28 ENCOUNTER — Encounter: Payer: Self-pay | Admitting: Family Medicine

## 2021-06-28 DIAGNOSIS — E782 Mixed hyperlipidemia: Secondary | ICD-10-CM | POA: Diagnosis not present

## 2021-06-28 DIAGNOSIS — F324 Major depressive disorder, single episode, in partial remission: Secondary | ICD-10-CM

## 2021-06-28 MED ORDER — BUPROPION HCL ER (XL) 150 MG PO TB24: 150.0000 mg | ORAL_TABLET | Freq: Every day | ORAL | 2 refills | Status: DC

## 2021-06-28 NOTE — Progress Notes (Signed)
Hector Chavez - 50 y.o. male MRN [id number]  Date of birth: [birth year]  Subjective Chief Complaint  Patient presents with   Mood    HPI Hector Chavez is a 50 year old male here today for a follow-up visit.  He reports that he is doing well at this time.  He was recently seen at urgent care due to pain in his thumb and wrist area.  He reports that he caught someone who was falling backwards down a set of stairs.  Pain is located along the distal radial aspect of the wrist.  He is currently taking steroids with some slight improvement.  He is using a splint as well.  He is taking bupropion 150 mg daily for treatment of depressive symptoms.  He reports that he is doing well with this.  He has not noted any significant side effects related to use.  He also continues to do well with atorvastatin 20 mg daily.  No side effects at this time  Depression screen [facility] 2/9 06/28/2021 05/17/2021 03/23/2020  Decreased Interest 2 1 3   Down, Depressed, Hopeless 1 1 2   PHQ - 2 Score 3 2 5   Altered sleeping 1 2 2   Tired, decreased energy 1 2 2   Change in appetite 2 3 3   Feeling bad or failure about yourself  1 3 3   Trouble concentrating 1 2 1   Moving slowly or fidgety/restless 0 0 1  Suicidal thoughts 1 1 1   PHQ-9 Score 10 15 18   Difficult doing work/chores Somewhat difficult Very difficult Somewhat difficult   GAD 7 : Generalized Anxiety Score 06/28/2021 05/17/2021 03/23/2020 01/30/2017  Nervous, Anxious, on Edge 1 1 1 1   Control/stop worrying 1 1 1 1   Worry too much - different things 1 1 1 1   Trouble relaxing 0 1 1 2   Restless 1 1 1  0  Easily annoyed or irritable 0 1 1 1   Afraid - awful might happen 2 2 1 2   Total GAD 7 Score 6 8 7 8   Anxiety Difficulty Somewhat difficult Somewhat difficult Somewhat difficult -   ROS:  A comprehensive ROS was completed and negative except as noted per HPI   Allergies  Allergen Reactions   Aspirin Other (See Comments)    REACTION: Dizzy, flushed in face    Past Medical  History:  Diagnosis Date   Allergy    Depression    Heart murmur    Hyperlipidemia     Past Surgical History:  Procedure Laterality Date   LT knee lateral release  1981   RT knee lateral release  1994    Social History   Socioeconomic History   Marital status: Married    Spouse name: Not on file   Number of children: Not on file   Years of education: Not on file   Highest education level: Not on file  Occupational History   Not on file  Tobacco Use   Smoking status: Former    Pack years: 0.00    Types: Cigarettes    Quit date: 12/24/2003    Years since quitting: 17.5   Smokeless tobacco: Never  Vaping Use   Vaping Use: Never used  Substance and Sexual Activity   Alcohol use: Yes    Alcohol/week: 21.0 standard drinks    Types: 21 Standard drinks or equivalent per week    Comment: per week (3 per day)   Drug use: No   Sexual activity: Not on file  Other Topics Concern   Not on file  Social History Occupational psychologist for [employer], married, 1 daughter   Social Determinants of Health   Financial Resource Strain: Not on file  Food Insecurity: Not on file  Transportation Needs: Not on file  Physical Activity: Not on file  Stress: Not on file  Social Connections: Not on file    Family History  Problem Relation Age of Onset   Dementia Mother 60       (alzheimers)   Heart attack Father 19   Hypertension Father    Brain cancer Father    Cancer Other        lung/aunt/breast/cousins/mom and dads side   Depression Other        nephew    Health Maintenance  Topic Date Due   HIV Screening  Never done   Hepatitis C Screening  Never done   TETANUS/TDAP  Never done   Zoster Vaccines- Shingrix (1 of 2) Never done   COVID-19 Vaccine (4 - Booster for Pfizer series) 04/10/2021   INFLUENZA VACCINE  07/23/2021   Fecal DNA (Cologuard)  04/25/2023   Pneumococcal Vaccine 89-25 Years old  Aged Out   HPV VACCINES  Aged Out      ----------------------------------------------------------------------------------------------------------------------------------------------------------------------------------------------------------------- Physical Exam BP (!) 152/79 (BP Location: Left Arm, Patient Position: Sitting, Cuff Size: Large)   Pulse 78   Temp 98.3 F (36.8 C)   Ht 5\' 11"  (1.803 m)   Wt 289 lb (131.1 kg)   SpO2 99%   BMI 40.31 kg/m   Physical Exam Constitutional:      Appearance: Normal appearance.  Cardiovascular:     Rate and Rhythm: Normal rate and regular rhythm.  Abdominal:     General: Abdomen is flat.     Palpations: Abdomen is soft.  Skin:    General: Skin is warm and dry.  Neurological:     General: No focal deficit present.     Mental Status: He is alert.  Psychiatric:        Mood and Affect: Mood normal.        Behavior: Behavior normal.    ------------------------------------------------------------------------------------------------------------------------------------------------------------------------------------------------------------------- Assessment and Plan  MDD (major depressive disorder) He seems to be doing well with bupropion at this time.  No significant side effects he has not had any noticeable weight gain since starting this.  We will continue on current strength.  Hyperlipidemia He is doing well with atorvastatin at current strength.  Continue at 20 mg daily.  He just started on prednisone for treatment of his wrist and thumb pain.  He will let me know if not resolving with this.  Meds ordered this encounter  Medications   buPROPion (WELLBUTRIN XL) 150 MG 24 hr tablet    Sig: Take 1 tablet (150 mg total) by mouth daily.    Dispense:  90 tablet    Refill:  2    Return in about 4 months (around 10/29/2021) for MDD/Cholesterol.    This visit occurred during the SARS-CoV-2 public health emergency.  Safety protocols were in place, including screening  questions prior to the visit, additional usage of staff PPE, and extensive cleaning of exam room while observing appropriate contact time as indicated for disinfecting solutions.

## 2021-06-28 NOTE — Patient Instructions (Signed)
Let me know if thumb/wrist pain is not resolving.  Continue bupropion (Wellbutrin) at current strength.  See me again in 3-4 months.

## 2021-06-29 NOTE — Assessment & Plan Note (Signed)
He seems to be doing well with bupropion at this time.  No significant side effects he has not had any noticeable weight gain since starting this.  We will continue on current strength.

## 2021-06-29 NOTE — Assessment & Plan Note (Signed)
He is doing well with atorvastatin at current strength.  Continue at 20 mg daily.

## 2021-07-16 ENCOUNTER — Other Ambulatory Visit: Payer: Self-pay | Admitting: Family Medicine

## 2021-07-16 DIAGNOSIS — F321 Major depressive disorder, single episode, moderate: Secondary | ICD-10-CM

## 2021-08-14 ENCOUNTER — Ambulatory Visit: Payer: No Typology Code available for payment source | Admitting: Sports Medicine

## 2021-08-14 ENCOUNTER — Other Ambulatory Visit: Payer: Self-pay

## 2021-08-14 DIAGNOSIS — M654 Radial styloid tenosynovitis [de Quervain]: Secondary | ICD-10-CM | POA: Diagnosis not present

## 2021-08-14 MED ORDER — MELOXICAM 15 MG PO TABS: ORAL_TABLET | ORAL | 3 refills | Status: DC

## 2021-08-14 NOTE — Assessment & Plan Note (Signed)
For over a month this pleasant 50 year old male has had pain on the radial aspect of his left wrist, no trauma, no overuse injuries. He was seen in urgent care, he was diagnosed appropriately with de Quervain's tendinitis. Placed in a thumb spica appropriately, unfortunately he has not noted any improvement, adding meloxicam, his brace did feel a bit tight so I removed the metal stabilizers and it made it somewhat more comfortable. Adding home conditioning exercises. Return to see me if not significantly better in a couple weeks and we can do an injection. Otherwise follow-up in a month.

## 2021-08-14 NOTE — Progress Notes (Signed)
    Procedures performed today:    None.  Independent interpretation of notes and tests performed by another provider:   None.  Brief History, Exam, Impression, and Recommendations:    De Quervain's tenosynovitis, left For over a month this pleasant 50 year old male has had pain on the radial aspect of his left wrist, no trauma, no overuse injuries. He was seen in urgent care, he was diagnosed appropriately with de Quervain's tendinitis. Placed in a thumb spica appropriately, unfortunately he has not noted any improvement, adding meloxicam, his brace did feel a bit tight so I removed the metal stabilizers and it made it somewhat more comfortable. Adding home conditioning exercises. Return to see me if not significantly better in a couple weeks and we can do an injection. Otherwise follow-up in a month.    ___________________________________________ Ihor Austin. Benjamin Stain, M.D., ABFM., CAQSM. Primary Care and Sports Medicine [facility]  Adjunct Instructor of Family Medicine  [employer]

## 2021-09-11 ENCOUNTER — Ambulatory Visit (INDEPENDENT_AMBULATORY_CARE_PROVIDER_SITE_OTHER): Payer: No Typology Code available for payment source

## 2021-09-11 ENCOUNTER — Ambulatory Visit: Payer: No Typology Code available for payment source | Admitting: Sports Medicine

## 2021-09-11 DIAGNOSIS — M654 Radial styloid tenosynovitis [de Quervain]: Secondary | ICD-10-CM | POA: Diagnosis not present

## 2021-09-11 NOTE — Progress Notes (Signed)
    Procedures performed today:    Procedure: Real-time Ultrasound Guided injection of the left first extensor compartment Device: Samsung HS60  Verbal informed consent obtained.  Time-out conducted.  Noted no overlying erythema, induration, or other signs of local infection.  Skin prepped in a sterile fashion.  Local anesthesia: Topical Ethyl chloride.  With sterile technique and under real time ultrasound guidance: Noted minimal synovitis, 1 cc Kenalog 40, 1 cc lidocaine, 1 cc bupivacaine injected easily Completed without difficulty  Advised to call if fevers/chills, erythema, induration, drainage, or persistent bleeding.  Images permanently stored and available for review in PACS.  Impression: Technically successful ultrasound guided injection.  Independent interpretation of notes and tests performed by another provider:   None.  Brief History, Exam, Impression, and Recommendations:    De Quervain's tenosynovitis, left Pleasant 50 year old male, persistent left radial wrist pain, clinically resembles de Quervain's tendinitis. Failed meloxicam, bracing, conditioning exercises, today we performed a left first extensor compartment injection. Return to see me in a month.    ___________________________________________ Ihor Austin. Benjamin Stain, M.D., ABFM., CAQSM. Primary Care and Sports Medicine [facility]  Adjunct Instructor of Family Medicine  [employer]

## 2021-09-11 NOTE — Assessment & Plan Note (Signed)
Pleasant 50 year old male, persistent left radial wrist pain, clinically resembles de Quervain's tendinitis. Failed meloxicam, bracing, conditioning exercises, today we performed a left first extensor compartment injection. Return to see me in a month.

## 2021-10-01 ENCOUNTER — Other Ambulatory Visit: Payer: Self-pay

## 2021-10-01 ENCOUNTER — Encounter: Payer: Self-pay | Admitting: Family Medicine

## 2021-10-01 ENCOUNTER — Ambulatory Visit: Payer: No Typology Code available for payment source | Admitting: Family Medicine

## 2021-10-01 VITALS — BP 154/89 | HR 74 | Ht 71.0 in | Wt 290.0 lb

## 2021-10-01 DIAGNOSIS — R030 Elevated blood-pressure reading, without diagnosis of hypertension: Secondary | ICD-10-CM

## 2021-10-01 DIAGNOSIS — L309 Dermatitis, unspecified: Secondary | ICD-10-CM

## 2021-10-01 MED ORDER — TRIAMCINOLONE ACETONIDE 0.1 % EX CREA: 1.0000 "application " | TOPICAL_CREAM | Freq: Two times a day (BID) | CUTANEOUS | 3 refills | Status: DC

## 2021-10-01 NOTE — Patient Instructions (Addendum)
ECZEMA CARE: - Use a mild, unscented soap (Dove Sensitive Skin, Aveeno, Vanicream products). - Bathe every other day if possible. Use mildly warm water. Lightly pat dry, do not dry completely or rub skin. - After the bath apply fragrance-free moisturizer (Vaseline, Aveeno Eczema Therapy, Aquaphor, Eucerin, Vanicream, CeraVe cream, Cetaphil Restoraderm, [employer]). These moisturizers can be applied multiple times per day to keep the skin from looking dry.  -Use gentle, fragrance-free laundry detergents  - Use the steroid ointment as indicated for flares. Apply the steroid creams to the skin first, then after a few minutes, top with moisturizer from list above.

## 2021-10-01 NOTE — Progress Notes (Signed)
Acute Office Visit  Subjective:    Patient ID: Hector Chavez, male    DOB: [birth year], 50 y.o.   MRN: [id number]  Chief Complaint  Patient presents with   Skin Problem    HPI Patient is in today for eczema flare.  Kewon states that he used to have eczema really bad as a child, but has been mostly fine during adulthood. Earlier this year it started to be bothersome, but he has done well managing with OTC lotions/creams. This summer he went to urgent care for tendinitis and was also given triamcinolone cream for an eczema flare. States it worked really well, but he ran out and it began flaring again over the past month. States there are red, dry, flaky, itchy patches throughout his upper and lower extremities. He denies any erythema, drainage, fevers, other rashes.      Past Medical History:  Diagnosis Date   Allergy    Depression    Heart murmur    Hyperlipidemia     Past Surgical History:  Procedure Laterality Date   LT knee lateral release  1981   RT knee lateral release  1994    Family History  Problem Relation Age of Onset   Dementia Mother 38       (alzheimers)   Heart attack Father 9   Hypertension Father    Brain cancer Father    Cancer Other        lung/aunt/breast/cousins/mom and dads side   Depression Other        nephew    Social History   Socioeconomic History   Marital status: Married    Spouse name: Not on file   Number of children: Not on file   Years of education: Not on file   Highest education level: Not on file  Occupational History   Not on file  Tobacco Use   Smoking status: Former    Types: Cigarettes    Quit date: 12/24/2003    Years since quitting: 17.7   Smokeless tobacco: Never  Vaping Use   Vaping Use: Never used  Substance and Sexual Activity   Alcohol use: Yes    Alcohol/week: 21.0 standard drinks    Types: 21 Standard drinks or equivalent per week    Comment: per week (3 per day)   Drug use: No   Sexual activity: Not  on file  Other Topics Concern   Not on file  Social History Narrative   Production manager for [employer], married, 1 daughter   Social Determinants of Health   Financial Resource Strain: Not on file  Food Insecurity: Not on file  Transportation Needs: Not on file  Physical Activity: Not on file  Stress: Not on file  Social Connections: Not on file  Intimate Partner Violence: Not on file    Outpatient Medications Prior to Visit  Medication Sig Dispense Refill   atorvastatin (LIPITOR) 20 MG tablet Take 1 tablet (20 mg total) by mouth daily. 90 tablet 3   buPROPion (WELLBUTRIN XL) 150 MG 24 hr tablet Take 1 tablet (150 mg total) by mouth daily. 90 tablet 2   tadalafil (CIALIS) 5 MG tablet Take 5 mg by mouth daily.     triamcinolone cream (KENALOG) 0.1 % Apply 1 application topically 2 (two) times daily. 30 g 0   escitalopram (LEXAPRO) 10 MG tablet TAKE 1 TABLET BY MOUTH EVERY DAY 90 tablet 1   No facility-administered medications prior to visit.    Allergies  Allergen Reactions  Aspirin Other (See Comments)    REACTION: Dizzy, flushed in face   Meloxicam Swelling    Caused feet/ankles to swell    Review of Systems All review of systems negative except what is listed in the HPI     Objective:    Physical Exam Vitals reviewed.  Constitutional:      Appearance: Normal appearance.  HENT:     Head: Normocephalic and atraumatic.  Musculoskeletal:        General: Normal range of motion.  Skin:    Comments: Eczema patches (erythematous, dry, flaky) on bilateral upper and lower extremities. No signs of infection  Neurological:     Mental Status: He is alert and oriented to person, place, and time.  Psychiatric:        Mood and Affect: Mood normal.        Behavior: Behavior normal.        Thought Content: Thought content normal.        Judgment: Judgment normal.    BP (!) 154/89   Pulse 74   Ht 5\' 11"  (1.803 m)   Wt 290 lb (131.5 kg)   SpO2 99%   BMI 40.45 kg/m  Wt  Readings from Last 3 Encounters:  10/01/21 290 lb (131.5 kg)  06/28/21 289 lb (131.1 kg)  06/25/21 290 lb (131.5 kg)    Health Maintenance Due  Topic Date Due   HIV Screening  Never done   Hepatitis C Screening  Never done   TETANUS/TDAP  Never done   Zoster Vaccines- Shingrix (1 of 2) Never done   COVID-19 Vaccine (4 - Booster for Pfizer series) 04/10/2021   INFLUENZA VACCINE  07/23/2021    There are no preventive care reminders to display for this patient.   Lab Results  Component Value Date   TSH 1.68 03/23/2020   Lab Results  Component Value Date   WBC 6.9 05/17/2021   HGB 16.6 05/17/2021   HCT 49.7 05/17/2021   MCV 91.2 05/17/2021   PLT 246 05/17/2021   Lab Results  Component Value Date   NA 141 05/17/2021   K 4.2 05/17/2021   CO2 28 05/17/2021   GLUCOSE 98 05/17/2021   BUN 10 05/17/2021   CREATININE 1.09 05/17/2021   BILITOT 0.6 05/17/2021   ALKPHOS 54 01/30/2017   AST 44 (H) 05/17/2021   ALT 65 (H) 05/17/2021   PROT 7.6 05/17/2021   ALBUMIN 4.7 01/30/2017   CALCIUM 9.9 05/17/2021   Lab Results  Component Value Date   CHOL 221 (H) 05/17/2021   Lab Results  Component Value Date   HDL 44 05/17/2021   Lab Results  Component Value Date   LDLCALC 124 (H) 05/17/2021   Lab Results  Component Value Date   TRIG 368 (H) 05/17/2021   Lab Results  Component Value Date   CHOLHDL 5.0 (H) 05/17/2021   Lab Results  Component Value Date   HGBA1C 5.2 01/30/2017       Assessment & Plan:   1. Acute eczema - Use a mild, unscented soap (Dove Sensitive Skin, Aveeno, Vanicream products). - Bathe every other day if possible. Use mildly warm water. Lightly pat dry, do not dry completely or rub skin. - After the bath apply fragrance-free moisturizer (Vaseline, Aveeno Eczema Therapy, Aquaphor, Eucerin, Vanicream, CeraVe cream, Cetaphil Restoraderm, 03/30/2017). These moisturizers can be applied multiple times per day to keep the skin from looking  dry.  -Use gentle, fragrance-free laundry detergents  - Use  the steroid ointment as indicated for flares. Apply the steroid creams to the skin first, then after a few minutes, top with moisturizer from list above.   - triamcinolone cream (KENALOG) 0.1 %; Apply 1 application topically 2 (two) times daily.  Dispense: 45 g; Refill: 3    2. Elevated blood-pressure reading without diagnosis of hypertension Reports he was stressed rushing to get here. Asymptomatic. States he has a way to check BP at home. Recommend he check every 1-2 days over the next week or two and send [place] a list of readings. MyChart reminder pre-scheduled to send in 2 weeks to ask for readings.   Patient aware of signs/symptoms requiring further/urgent evaluation.  Follow-up with blood pressure readings via MyChart in 2 weeks or sooner if needed.   Lollie Marrow Reola Calkins, DNP, FNP-C

## 2021-10-09 ENCOUNTER — Ambulatory Visit: Payer: No Typology Code available for payment source | Admitting: Sports Medicine

## 2021-10-14 ENCOUNTER — Encounter: Payer: Self-pay | Admitting: Family Medicine

## 2021-11-01 ENCOUNTER — Other Ambulatory Visit: Payer: Self-pay

## 2021-11-01 ENCOUNTER — Ambulatory Visit: Payer: No Typology Code available for payment source | Admitting: Family Medicine

## 2021-11-01 ENCOUNTER — Encounter: Payer: Self-pay | Admitting: Family Medicine

## 2021-11-01 VITALS — BP 144/79 | HR 62 | Temp 97.8°F | Ht 71.0 in | Wt 292.0 lb

## 2021-11-01 DIAGNOSIS — F324 Major depressive disorder, single episode, in partial remission: Secondary | ICD-10-CM

## 2021-11-01 DIAGNOSIS — E782 Mixed hyperlipidemia: Secondary | ICD-10-CM | POA: Diagnosis not present

## 2021-11-01 NOTE — Assessment & Plan Note (Signed)
Update lipid panel and CMP today.

## 2021-11-01 NOTE — Assessment & Plan Note (Signed)
Depression remains well controlled with bupropion.  Continue current strength.

## 2021-11-01 NOTE — Progress Notes (Signed)
Hector Chavez - 50 y.o. male MRN [id number]  Date of birth: 01-27-1971  Subjective Chief Complaint  Patient presents with   MDD    HPI [name] is a 50 year old male here today for follow-up visit.  He reports that he is doing well with bupropion for management of his depression.  He has not noted any side effects related to this.  He would like to continue this at this time.  He is tolerating atorvastatin well for management of hyperlipidemia.  He is due to have updated labs today.  He has not really made any changes to his diet since his last visit.  Weight is up a couple of pounds.  ROS:  A comprehensive ROS was completed and negative except as noted per HPI  Allergies  Allergen Reactions   Aspirin Other (See Comments)    REACTION: Dizzy, flushed in face   Meloxicam Swelling    Caused feet/ankles to swell    Past Medical History:  Diagnosis Date   Allergy    Depression    Heart murmur    Hyperlipidemia     Past Surgical History:  Procedure Laterality Date   LT knee lateral release  1981   RT knee lateral release  1994    Social History   Socioeconomic History   Marital status: Married    Spouse name: Not on file   Number of children: Not on file   Years of education: Not on file   Highest education level: Not on file  Occupational History   Not on file  Tobacco Use   Smoking status: Former    Types: Cigarettes    Quit date: 12/24/2003    Years since quitting: 17.8   Smokeless tobacco: Never  Vaping Use   Vaping Use: Never used  Substance and Sexual Activity   Alcohol use: Yes    Alcohol/week: 21.0 standard drinks    Types: 21 Standard drinks or equivalent per week    Comment: per week (3 per day)   Drug use: No   Sexual activity: Not on file  Other Topics Concern   Not on file  Social History Narrative   Production manager for [employer], married, 1 daughter   Social Determinants of Health   Financial Resource Strain: Not on file  Food Insecurity: Not on file   Transportation Needs: Not on file  Physical Activity: Not on file  Stress: Not on file  Social Connections: Not on file    Family History  Problem Relation Age of Onset   Dementia Mother 70       (alzheimers)   Heart attack Father 3   Hypertension Father    Brain cancer Father    Cancer Other        lung/aunt/breast/cousins/mom and dads side   Depression Other        nephew    Health Maintenance  Topic Date Due   HIV Screening  Never done   Hepatitis C Screening  Never done   TETANUS/TDAP  Never done   Zoster Vaccines- Shingrix (1 of 2) Never done   COVID-19 Vaccine (3 - Booster for Pfizer series) 02/04/2021   Fecal DNA (Cologuard)  04/25/2023   INFLUENZA VACCINE  Completed   Pneumococcal Vaccine 23-30 Years old  Aged Out   HPV VACCINES  Aged Out     ----------------------------------------------------------------------------------------------------------------------------------------------------------------------------------------------------------------- Physical Exam BP (!) 144/79 (BP Location: Left Arm, Patient Position: Sitting, Cuff Size: Large)   Pulse 62   Temp 97.8 F (36.6  C)   Ht 5\' 11"  (1.803 m)   Wt 292 lb (132.5 kg)   SpO2 100%   BMI 40.73 kg/m   Physical Exam Constitutional:      Appearance: Normal appearance.  HENT:     Head: Normocephalic and atraumatic.  Eyes:     General: No scleral icterus. Cardiovascular:     Rate and Rhythm: Normal rate and regular rhythm.  Pulmonary:     Effort: Pulmonary effort is normal.     Breath sounds: Normal breath sounds.  Musculoskeletal:     Cervical back: Neck supple.  Neurological:     Mental Status: He is alert.  Psychiatric:        Mood and Affect: Mood normal.        Behavior: Behavior normal.     ------------------------------------------------------------------------------------------------------------------------------------------------------------------------------------------------------------------- Assessment and Plan  Mixed hyperlipidemia Update lipid panel and CMP today.  MDD (major depressive disorder) Depression remains well controlled with bupropion.  Continue current strength.   No orders of the defined types were placed in this encounter.   Return in about 6 months (around 05/01/2022) for HLD/MDD.    This visit occurred during the SARS-CoV-2 public health emergency.  Safety protocols were in place, including screening questions prior to the visit, additional usage of staff PPE, and extensive cleaning of exam room while observing appropriate contact time as indicated for disinfecting solutions.

## 2022-01-04 ENCOUNTER — Ambulatory Visit: Payer: No Typology Code available for payment source | Admitting: Family Medicine

## 2022-01-04 ENCOUNTER — Encounter: Payer: Self-pay | Admitting: Family Medicine

## 2022-01-04 ENCOUNTER — Other Ambulatory Visit: Payer: Self-pay

## 2022-01-04 VITALS — BP 144/94 | HR 64 | Temp 97.5°F | Ht 71.0 in | Wt 296.0 lb

## 2022-01-04 DIAGNOSIS — E782 Mixed hyperlipidemia: Secondary | ICD-10-CM

## 2022-01-04 DIAGNOSIS — H539 Unspecified visual disturbance: Secondary | ICD-10-CM

## 2022-01-04 DIAGNOSIS — I10 Essential (primary) hypertension: Secondary | ICD-10-CM | POA: Diagnosis not present

## 2022-01-04 MED ORDER — AMLODIPINE BESYLATE 5 MG PO TABS: 5.0000 mg | ORAL_TABLET | Freq: Every day | ORAL | 1 refills | Status: DC

## 2022-01-04 NOTE — Progress Notes (Signed)
Hector Chavez - 51 y.o. male MRN [id number]  Date of birth: [birth year]  Subjective Chief Complaint  Patient presents with   Eye Problem    HPI Hector Chavez is a 51 y.o. male here today for complaint of transient vision loss.  Reports that he was getting up to get a refill of his drink the night before last when he suddenly experienced loss of vision in the lower vision field of his L eye.  He did not have any other neurological symptoms including dizziness, headache, weakness, numbness/tingling, or speech changes.  His symptoms lasted approximately 5 minutes and resolved spontaneously.  He has not had any further symptoms since this episode.  He does have risk factors of HLD and BP is elevated.    ROS:  A comprehensive ROS was completed and negative except as noted per HPI  Allergies  Allergen Reactions   Aspirin Other (See Comments)    REACTION: Dizzy, flushed in face   Meloxicam Swelling    Caused feet/ankles to swell    Past Medical History:  Diagnosis Date   Allergy    Depression    Heart murmur    Hyperlipidemia     Past Surgical History:  Procedure Laterality Date   LT knee lateral release  1981   RT knee lateral release  1994    Social History   Socioeconomic History   Marital status: Married    Spouse name: Not on file   Number of children: Not on file   Years of education: Not on file   Highest education level: Not on file  Occupational History   Not on file  Tobacco Use   Smoking status: Former    Types: Cigarettes    Quit date: 12/24/2003    Years since quitting: 18.0   Smokeless tobacco: Never  Vaping Use   Vaping Use: Never used  Substance and Sexual Activity   Alcohol use: Yes    Alcohol/week: 21.0 standard drinks    Types: 21 Standard drinks or equivalent per week    Comment: per week (3 per day)   Drug use: No   Sexual activity: Not on file  Other Topics Concern   Not on file  Social History Narrative   Banker for [employer], married, 1  daughter   Social Determinants of Health   Financial Resource Strain: Not on file  Food Insecurity: Not on file  Transportation Needs: Not on file  Physical Activity: Not on file  Stress: Not on file  Social Connections: Not on file    Family History  Problem Relation Age of Onset   Dementia Mother 88       (alzheimers)   Heart attack Father 86   Hypertension Father    Brain cancer Father    Cancer Other        lung/aunt/breast/cousins/mom and dads side   Depression Other        nephew    Health Maintenance  Topic Date Due   HIV Screening  Never done   Hepatitis C Screening  Never done   TETANUS/TDAP  Never done   Zoster Vaccines- Shingrix (1 of 2) Never done   COVID-19 Vaccine (3 - Booster for Pfizer series) 02/04/2021   Fecal DNA (Cologuard)  04/25/2023   INFLUENZA VACCINE  Completed   Pneumococcal Vaccine 75-84 Years old  Aged Out   HPV VACCINES  Aged Out     ----------------------------------------------------------------------------------------------------------------------------------------------------------------------------------------------------------------- Physical Exam BP (!) 142/91 (BP Location: Left Arm, Patient  Position: Sitting, Cuff Size: Large)    Pulse 66    Temp (!) 97.5 F (36.4 C)    Ht 5' 11" (1.803 m)    Wt 296 lb (134.3 kg)    SpO2 98%    BMI 41.28 kg/m   Physical Exam Constitutional:      Appearance: Normal appearance.  HENT:     Head: Normocephalic and atraumatic.  Eyes:     General: Lids are normal. No scleral icterus.    Extraocular Movements: Extraocular movements intact.     Conjunctiva/sclera: Conjunctivae normal.     Pupils: Pupils are equal, round, and reactive to light.     Visual Fields: Right eye visual fields normal and left eye visual fields normal.     Comments: Unable to visualize disc on funduscopic exam  Cardiovascular:     Rate and Rhythm: Regular rhythm.  Pulmonary:     Effort: Pulmonary effort is normal.      Breath sounds: Normal breath sounds.  Musculoskeletal:     Cervical back: Neck supple.  Skin:    General: Skin is warm and dry.  Neurological:     General: No focal deficit present.     Mental Status: He is alert and oriented to person, place, and time.     Cranial Nerves: No cranial nerve deficit.     Motor: No weakness.     Gait: Gait normal.  Psychiatric:        Mood and Affect: Mood normal.        Behavior: Behavior normal.    ------------------------------------------------------------------------------------------------------------------------------------------------------------------------------------------------------------------- Assessment and Plan  Transient vision disturbance, left Resolved at this point but needs further work up to rule our TIA or ocular issue.  Referral placed urgently to ophthalmology.  Orders for CMP and lipid panel for risk stratification.  Checking ESR today as well.  Imaging ordered includes MRI or brain, carotid US and echocardiogram.  I will have him start 50m asa.  Instructed to seek emergency care if symptoms return.    Hypertension Starting amlodipine 574mdaily.  F/u in 4 weeks.    No orders of the defined types were placed in this encounter.   No follow-ups on file.    This visit occurred during the SARS-CoV-2 public health emergency.  Safety protocols were in place, including screening questions prior to the visit, additional usage of staff PPE, and extensive cleaning of exam room while observing appropriate contact time as indicated for disinfecting solutions.

## 2022-01-04 NOTE — Assessment & Plan Note (Signed)
Starting amlodipine 5mg  daily.  F/u in 4 weeks.

## 2022-01-04 NOTE — Patient Instructions (Addendum)
Start amlodipine for BP Start 81mg  aspirin daily  We'll be in touch with lab results. IF SYMPTOMS RETURN GO TO THE ER!

## 2022-01-04 NOTE — Assessment & Plan Note (Signed)
Resolved at this point but needs further work up to rule our TIA or ocular issue.  Referral placed urgently to ophthalmology.  Orders for CMP and lipid panel for risk stratification.  Checking ESR today as well.  Imaging ordered includes MRI or brain, carotid US and echocardiogram.  I will have him start 22m asa.  Instructed to seek emergency care if symptoms return.

## 2022-01-05 ENCOUNTER — Other Ambulatory Visit: Payer: Self-pay | Admitting: Family Medicine

## 2022-01-05 DIAGNOSIS — Z0189 Encounter for other specified special examinations: Secondary | ICD-10-CM

## 2022-01-05 DIAGNOSIS — H539 Unspecified visual disturbance: Secondary | ICD-10-CM

## 2022-01-05 DIAGNOSIS — Z1389 Encounter for screening for other disorder: Secondary | ICD-10-CM

## 2022-01-05 LAB — COMPLETE METABOLIC PANEL WITH GFR
AG Ratio: 1.4 (calc) (ref 1.0–2.5)
ALT: 62 U/L — ABNORMAL HIGH (ref 9–46)
AST: 42 U/L — ABNORMAL HIGH (ref 10–35)
Albumin: 4.5 g/dL (ref 3.6–5.1)
Alkaline phosphatase (APISO): 65 U/L (ref 35–144)
BUN: 12 mg/dL (ref 7–25)
CO2: 30 mmol/L (ref 20–32)
Calcium: 10 mg/dL (ref 8.6–10.3)
Chloride: 104 mmol/L (ref 98–110)
Creat: 1.21 mg/dL (ref 0.70–1.30)
Globulin: 3.2 g/dL (calc) (ref 1.9–3.7)
Glucose, Bld: 116 mg/dL — ABNORMAL HIGH (ref 65–99)
Potassium: 4.7 mmol/L (ref 3.5–5.3)
Sodium: 143 mmol/L (ref 135–146)
Total Bilirubin: 0.6 mg/dL (ref 0.2–1.2)
Total Protein: 7.7 g/dL (ref 6.1–8.1)
eGFR: 72 mL/min/{1.73_m2} (ref >=60)

## 2022-01-05 LAB — LIPID PANEL W/REFLEX DIRECT LDL
Cholesterol: 206 mg/dL — ABNORMAL HIGH (ref <200)
HDL: 52 mg/dL (ref >=40)
LDL Cholesterol (Calc): 124 mg/dL (calc) — ABNORMAL HIGH
Non-HDL Cholesterol (Calc): 154 mg/dL (calc) — ABNORMAL HIGH (ref <130)
Total CHOL/HDL Ratio: 4 (calc) (ref <5.0)
Triglycerides: 182 mg/dL — ABNORMAL HIGH (ref <150)

## 2022-01-05 LAB — SEDIMENTATION RATE: Sed Rate: 2 mm/h (ref 0–20)

## 2022-01-07 ENCOUNTER — Other Ambulatory Visit: Payer: Self-pay

## 2022-01-07 ENCOUNTER — Ambulatory Visit (INDEPENDENT_AMBULATORY_CARE_PROVIDER_SITE_OTHER): Payer: No Typology Code available for payment source

## 2022-01-07 ENCOUNTER — Telehealth: Payer: Self-pay | Admitting: Sports Medicine

## 2022-01-07 ENCOUNTER — Other Ambulatory Visit: Payer: Self-pay | Admitting: Family Medicine

## 2022-01-07 DIAGNOSIS — Z1389 Encounter for screening for other disorder: Secondary | ICD-10-CM

## 2022-01-07 DIAGNOSIS — H539 Unspecified visual disturbance: Secondary | ICD-10-CM | POA: Diagnosis not present

## 2022-01-07 DIAGNOSIS — S0095XA Superficial foreign body of unspecified part of head, initial encounter: Secondary | ICD-10-CM | POA: Insufficient documentation

## 2022-01-07 DIAGNOSIS — S0095XS Superficial foreign body of unspecified part of head, sequela: Secondary | ICD-10-CM

## 2022-01-07 DIAGNOSIS — Z09 Encounter for follow-up examination after completed treatment for conditions other than malignant neoplasm: Secondary | ICD-10-CM

## 2022-01-07 DIAGNOSIS — Z0189 Encounter for other specified special examinations: Secondary | ICD-10-CM

## 2022-01-07 DIAGNOSIS — H5462 Unqualified visual loss, left eye, normal vision right eye: Secondary | ICD-10-CM

## 2022-01-07 NOTE — Telephone Encounter (Signed)
Hector Chavez needs an MRI, based on symptom report from his last visit with Dr. Matthews however he does have a metallic foreign object, difficult to tell whether its intracranial, and the inner table or under the forehead soft tissues, he does have a CT from 2009 indicating that it may be in the forehead soft tissues. °We are going to proceed with a CT head without contrast to clarify this, if it is in the inner table or forehead soft tissues we can proceed with MRI, if its intracranial we will need to come up with another plan. °

## 2022-01-07 NOTE — Assessment & Plan Note (Signed)
Hector Chavez needs an MRI, based on symptom report from his last visit with Dr. Zigmund Daniel however he does have a metallic foreign object, difficult to tell whether its intracranial, and the inner table or under the forehead soft tissues, he does have a CT from 2009 indicating that it may be in the forehead soft tissues. We are going to proceed with a CT head without contrast to clarify this, if it is in the inner table or forehead soft tissues we can proceed with MRI, if its intracranial we will need to come up with another plan.

## 2022-01-10 ENCOUNTER — Other Ambulatory Visit: Payer: Self-pay | Admitting: Family Medicine

## 2022-01-10 ENCOUNTER — Encounter: Payer: Self-pay | Admitting: Family Medicine

## 2022-01-10 DIAGNOSIS — H539 Unspecified visual disturbance: Secondary | ICD-10-CM

## 2022-01-11 ENCOUNTER — Encounter: Payer: Self-pay | Admitting: Family Medicine

## 2022-01-14 ENCOUNTER — Other Ambulatory Visit: Payer: Self-pay

## 2022-01-14 ENCOUNTER — Ambulatory Visit (HOSPITAL_COMMUNITY)
Admission: RE | Admit: 2022-01-14 | Discharge: 2022-01-14 | Disposition: A | Discharge status: Home / Self Care | Payer: No Typology Code available for payment source | Source: Ambulatory Visit | Attending: Family Medicine | Admitting: Family Medicine

## 2022-01-14 DIAGNOSIS — G459 Transient cerebral ischemic attack, unspecified: Secondary | ICD-10-CM | POA: Diagnosis not present

## 2022-01-14 DIAGNOSIS — H539 Unspecified visual disturbance: Secondary | ICD-10-CM | POA: Diagnosis present

## 2022-01-14 DIAGNOSIS — H53122 Transient visual loss, left eye: Secondary | ICD-10-CM

## 2022-01-14 DIAGNOSIS — I10 Essential (primary) hypertension: Secondary | ICD-10-CM

## 2022-01-14 DIAGNOSIS — E785 Hyperlipidemia, unspecified: Secondary | ICD-10-CM | POA: Insufficient documentation

## 2022-01-14 LAB — ECHOCARDIOGRAM COMPLETE
Area-P 1/2: 2.91 cm2
Calc EF: 57.3 %
S' Lateral: 3.2 cm
Single Plane A2C EF: 58.6 %
Single Plane A4C EF: 53.2 %

## 2022-01-15 ENCOUNTER — Ambulatory Visit: Payer: No Typology Code available for payment source | Admitting: Family Medicine

## 2022-01-15 ENCOUNTER — Ambulatory Visit (INDEPENDENT_AMBULATORY_CARE_PROVIDER_SITE_OTHER): Payer: No Typology Code available for payment source

## 2022-01-15 ENCOUNTER — Encounter: Payer: Self-pay | Admitting: Family Medicine

## 2022-01-15 VITALS — BP 136/82 | HR 77 | Temp 97.9°F | Ht 71.0 in | Wt 296.0 lb

## 2022-01-15 DIAGNOSIS — H5460 Unqualified visual loss, one eye, unspecified: Secondary | ICD-10-CM

## 2022-01-15 DIAGNOSIS — G459 Transient cerebral ischemic attack, unspecified: Secondary | ICD-10-CM | POA: Diagnosis not present

## 2022-01-15 DIAGNOSIS — H539 Unspecified visual disturbance: Secondary | ICD-10-CM

## 2022-01-15 DIAGNOSIS — R42 Dizziness and giddiness: Secondary | ICD-10-CM

## 2022-01-15 MED ORDER — CLOPIDOGREL BISULFATE 75 MG PO TABS: 75.0000 mg | ORAL_TABLET | Freq: Every day | ORAL | 0 refills | Status: DC

## 2022-01-15 MED ORDER — ATORVASTATIN CALCIUM 40 MG PO TABS: 40.0000 mg | ORAL_TABLET | Freq: Every day | ORAL | 1 refills | Status: DC

## 2022-01-15 MED ORDER — IOHEXOL 350 MG/ML SOLN
100.0000 mL | Freq: Once | INTRAVENOUS | Status: AC | PRN
  Administered 2022-01-15: 14:00:00 100 mL via INTRAVENOUS

## 2022-01-15 NOTE — Progress Notes (Signed)
Hector Chavez - 51 y.o. male MRN [id number]  Date of birth: 09-Apr-1971  Subjective No chief complaint on file.   HPI Hector Chavez is a 51 year old male here today for follow-up of recent transient vision loss.  Since his last visit with me he has had carotid ultrasound, MRI of the brain, and echocardiogram.  MRI did show possible hemodynamically significant lesion of the ICA.  Additional CT angio of the head and neck were ordered and this was completed today.  He did see ophthalmology.  Symptoms likely related to TIA.  He has been taking daily 81 mg aspirin.  Despite this he did have another episode a few days ago.  Episode was similar to initial episode with transient vision loss in the lower half of the left eye.  This lasted for a few minutes.  He has had some dizziness since that time as well.  He denies headaches or other neuro changes.  ROS:  A comprehensive ROS was completed and negative except as noted per HPI  Allergies  Allergen Reactions   Aspirin Other (See Comments)    REACTION: Dizzy, flushed in face   Meloxicam Swelling    Caused feet/ankles to swell    Past Medical History:  Diagnosis Date   Allergy    Depression    Heart murmur    Hyperlipidemia     Past Surgical History:  Procedure Laterality Date   LT knee lateral release  1981   RT knee lateral release  1994    Social History   Socioeconomic History   Marital status: Married    Spouse name: Not on file   Number of children: Not on file   Years of education: Not on file   Highest education level: Not on file  Occupational History   Not on file  Tobacco Use   Smoking status: Former    Types: Cigarettes    Quit date: 12/24/2003    Years since quitting: 18.0   Smokeless tobacco: Never  Vaping Use   Vaping Use: Never used  Substance and Sexual Activity   Alcohol use: Yes    Alcohol/week: 21.0 standard drinks    Types: 21 Standard drinks or equivalent per week    Comment: per week (3 per day)   Drug use: No    Sexual activity: Not on file  Other Topics Concern   Not on file  Social History Narrative   Production manager for [employer], married, 1 daughter   Social Determinants of Health   Financial Resource Strain: Not on file  Food Insecurity: Not on file  Transportation Needs: Not on file  Physical Activity: Not on file  Stress: Not on file  Social Connections: Not on file    Family History  Problem Relation Age of Onset   Dementia Mother 30       (alzheimers)   Heart attack Father 82   Hypertension Father    Brain cancer Father    Cancer Other        lung/aunt/breast/cousins/mom and dads side   Depression Other        nephew    Health Maintenance  Topic Date Due   HIV Screening  Never done   Hepatitis C Screening  Never done   TETANUS/TDAP  Never done   Zoster Vaccines- Shingrix (1 of 2) Never done   COVID-19 Vaccine (3 - Booster for Pfizer series) 02/04/2021   Fecal DNA (Cologuard)  04/25/2023   INFLUENZA VACCINE  Completed   HPV VACCINES  Aged Out     ----------------------------------------------------------------------------------------------------------------------------------------------------------------------------------------------------------------- Physical Exam BP 136/82 (BP Location: Left Arm, Patient Position: Sitting, Cuff Size: Large)    Pulse 77    Temp 97.9 F (36.6 C)    Ht 5\' 11"  (1.803 m)    Wt 296 lb (134.3 kg)    SpO2 99%    BMI 41.28 kg/m   Physical Exam Constitutional:      Appearance: Normal appearance.  Eyes:     General: No scleral icterus. Cardiovascular:     Rate and Rhythm: Normal rate and regular rhythm.  Pulmonary:     Effort: Pulmonary effort is normal.     Breath sounds: Normal breath sounds.  Musculoskeletal:     Cervical back: Neck supple.  Neurological:     Mental Status: He is alert.  Psychiatric:        Mood and Affect: Mood normal.        Behavior: Behavior normal.     ------------------------------------------------------------------------------------------------------------------------------------------------------------------------------------------------------------------- Assessment and Plan  Transient vision disturbance, left He had a repetitive episode recently.  Given repetitive episode and possible carotid lesion, have him add dual antiplatelet therapy with Plavix and 81 mg aspirin x90 days with switch to 81 mg aspirin thereafter.  Aggressive lipid control with increased dose of atorvastatin to 40 mg daily.  Referral placed to neurology for any further recommendations.  We will also obtain 7-day Holter to evaluate for any arrhythmias that may be contributing although embolic source I think is less likely.  Instructed to seek emergency care if symptoms return.   Meds ordered this encounter  Medications   clopidogrel (PLAVIX) 75 MG tablet    Sig: Take 1 tablet (75 mg total) by mouth daily.    Dispense:  90 tablet    Refill:  0   atorvastatin (LIPITOR) 40 MG tablet    Sig: Take 1 tablet (40 mg total) by mouth daily.    Dispense:  90 tablet    Refill:  1    Return in about 3 weeks (around 02/05/2022) for TIA.    This visit occurred during the SARS-CoV-2 public health emergency.  Safety protocols were in place, including screening questions prior to the visit, additional usage of staff PPE, and extensive cleaning of exam room while observing appropriate contact time as indicated for disinfecting solutions.

## 2022-01-15 NOTE — Assessment & Plan Note (Addendum)
He had a repetitive episode recently.  Given repetitive episode and possible carotid lesion, have him add dual antiplatelet therapy with Plavix and 81 mg aspirin x90 days with switch to 81 mg aspirin thereafter.  Aggressive lipid control with increased dose of atorvastatin to 40 mg daily.  Referral placed to neurology for any further recommendations.  We will also obtain 7-day Holter to evaluate for any arrhythmias that may be contributing although embolic source I think is less likely.  Instructed to seek emergency care if symptoms return.

## 2022-01-15 NOTE — Patient Instructions (Addendum)
Start plavix 75mg  daily with aspirin 81mg .  Continue this for 90 days and then change to 81mg  aspirin.  You should get a call to schedule a neurology.

## 2022-01-16 ENCOUNTER — Ambulatory Visit (INDEPENDENT_AMBULATORY_CARE_PROVIDER_SITE_OTHER): Payer: No Typology Code available for payment source

## 2022-01-16 ENCOUNTER — Other Ambulatory Visit: Payer: Self-pay | Admitting: Family Medicine

## 2022-01-16 DIAGNOSIS — G459 Transient cerebral ischemic attack, unspecified: Secondary | ICD-10-CM

## 2022-01-16 DIAGNOSIS — I4891 Unspecified atrial fibrillation: Secondary | ICD-10-CM

## 2022-01-16 NOTE — Progress Notes (Unsigned)
Enrolled for [employer] to mail a ZIO XT long term holter monitor to the patients address on file.  

## 2022-01-18 ENCOUNTER — Telehealth: Payer: Self-pay

## 2022-01-18 ENCOUNTER — Encounter: Payer: Self-pay | Admitting: Family Medicine

## 2022-01-18 DIAGNOSIS — G459 Transient cerebral ischemic attack, unspecified: Secondary | ICD-10-CM | POA: Insufficient documentation

## 2022-01-18 NOTE — Telephone Encounter (Signed)
Faxed Disability Leave application to [employer].   Fax confirmation received.

## 2022-01-21 ENCOUNTER — Ambulatory Visit: Payer: No Typology Code available for payment source | Admitting: Neurology

## 2022-01-21 ENCOUNTER — Encounter: Payer: Self-pay | Admitting: Neurology

## 2022-01-21 VITALS — BP 182/98 | HR 84 | Ht 71.0 in | Wt 293.0 lb

## 2022-01-21 DIAGNOSIS — R410 Disorientation, unspecified: Secondary | ICD-10-CM | POA: Insufficient documentation

## 2022-01-21 DIAGNOSIS — H538 Other visual disturbances: Secondary | ICD-10-CM

## 2022-01-21 NOTE — Progress Notes (Signed)
Chief Complaint  Patient presents with   New Patient (Initial Visit)    Room 12 with wife, Langley Gauss. Referred for TIA.Two episodes. First event, spotty vision followed by partial blindness in left eye (after five minutes, it returned back to baseline). Also, experienced dizziness, generalized weakness, left-sided tingling. Second event involved just dizziness and left-sided tingling. Also, lasting five minutes. He is currently taking both aspirin 54m, daily and Plavix 754m daily.      ASSESSMENT AND PLAN  Hector Chavez a [age].o. male   Episode of sudden onset left-sided paresthesia, also left lower vision loss,  Not in the same vascular territory, MRI of the brain was normal,  Does has vascular risk factor of hypertension, obesity, excessive alcohol use, hyperlipidemia,  He was put on aspirin and Plavix since that episode, will only need aspirin 81 mg daily,  Agree with Holter monitoring to rule out cardiac arrhythmia, embolic event,  Differentiation diagnoses also including migraine variant,  Advised him increase water intake, stop excessive alcohol use, optimize vascular risk factor control,  EEG with his reported episode of confusion,  Return to clinic in 6 months with nurse practitioner   DIAGNOSTIC DATA (LABS, IMAGING, TESTING) - I reviewed patient records, labs, notes, testing and imaging myself where available.  MRI of the brain without contrast January 2023: No acute abnormality CT angiogram of the neck and head with and without contrast no large vessel disease, Ultrasound of carotid artery: Negative CT head without contrast, metallic foreign body embedded in the right frontal bone  Laboratory evaluations in January 2023: LDL 124, cholesterol 206, triglyceride 182 CMP, creatinine 1.21, glucose 116, normal ESR,   MEDICAL HISTORY:  Hector Chavez a [age]ear old male, accompanied by his wife, seen in request by his primary care physician Dr. MaZigmund DanielCoEinar Pheasantor  evaluation of sudden onset visual change, left arm numbness, initial evaluation was on January 21, 2022.  I reviewed and summarized the referring note.  Past medical history Hypertension Hyperlipidemia Depression  Patient suffered a lot of work-related depression anxiety, started to receive Wellbutrin few months ago, still dealing with some mood disorder, he also has a history of migraine, but denies a history of visual with his migraine in the past  On January 03, 2022 at evening while watching TV, when he stands up going to the kitchen, he suddenly noticed vision change, involving left lower visual field, he close left eye, stated to right eye vision is normal, but at the left bottom vision, he noticed black and white kaleidoscope imaging, at the same time he felt dizziness, unsteady sensation, tingling in his left arm, he has to grab countertop to keep himself steady, the left lower vision eventually went black, lasting for 5 minutes, gradually recovered, he denies headache, he did report heart palpitation, thought it was due to nervousness from his sudden visual loss  Wife also reported few episode of confusion episode over the past few months, he forgot he has changed bed sheets, there is another incident he forgot he has put down the Christmas preparation.  He was seen by ophthalmologist Dr. KiBriscoe DeutscherMuClide Dalesecently, there was no significant abnormality found  On January 23.  2023, he was on his appointment for echocardiogram, in a standing position, he had sudden onset of dizziness, unsteady sensation, he has to sit down, felt left arm, left foot numbness, mild confusion, no visual change, no headache  He does drink wine up to 20 ounce daily  He  extensive evaluation  in January 2023, MRI of the brain reviewed with patient, no significant abnormality, CT angiogram of head showed no large vessel disease, ultrasound of carotid arteries showed no significant abnormality.  Echocardiogram was  within normal limit, he has cardiac monitoring pending  PHYSICAL EXAM:   Vitals:   01/21/22 1458  BP: (!) 182/98  Pulse: 84  Weight: 293 lb (132.9 kg)  Height: 5' 11"  (1.803 m)   Not recorded     Body mass index is 40.87 kg/m.  PHYSICAL EXAMNIATION:  Gen: NAD, conversant, well nourised, well groomed                     Cardiovascular: Regular rate rhythm, no peripheral edema, warm, nontender. Eyes: Conjunctivae clear without exudates or hemorrhage Neck: Supple, no carotid bruits. Pulmonary: Clear to auscultation bilaterally   NEUROLOGICAL EXAM:  MENTAL STATUS: Speech:    Speech is normal; fluent and spontaneous with normal comprehension.  Cognition:     Orientation to time, place and person     Normal recent and remote memory     Normal Attention span and concentration     Normal Language, naming, repeating,spontaneous speech     Fund of knowledge   CRANIAL NERVES: CN II: Visual fields are full to confrontation. Pupils are round equal and briskly reactive to light. CN III, IV, VI: extraocular movement are normal. No ptosis. CN V: Facial sensation is intact to light touch CN VII: Face is symmetric with normal eye closure  CN VIII: Hearing is normal to causal conversation. CN IX, X: Phonation is normal. CN XI: Head turning and shoulder shrug are intact  MOTOR: There is no pronator drift of out-stretched arms. Muscle bulk and tone are normal. Muscle strength is normal.  REFLEXES: Reflexes are 2+ and symmetric at the biceps, triceps, Chavez, and ankles. Plantar responses are flexor.  SENSORY: Intact to light touch, pinprick and vibratory sensation are intact in fingers and toes.  COORDINATION: There is no trunk or limb dysmetria noted.  GAIT/STANCE: Posture is normal. Gait is steady with normal steps, base, arm swing, and turning. Heel and toe walking are normal. Tandem gait is normal.  Romberg is absent.  REVIEW OF SYSTEMS:  Full 14 system review of systems  performed and notable only for as above All other review of systems were negative.   ALLERGIES: Allergies  Allergen Reactions   Aspirin Other (See Comments)    REACTION: Dizzy, flushed in face - doing okay on aspirin 15m daily.   Meloxicam Swelling    Caused feet/ankles to swell    HOME MEDICATIONS: Current Outpatient Medications  Medication Sig Dispense Refill   amLODipine (NORVASC) 5 MG tablet Take 1 tablet (5 mg total) by mouth daily. 90 tablet 1   aspirin EC 81 MG tablet Take 81 mg by mouth daily. Swallow whole.     atorvastatin (LIPITOR) 40 MG tablet Take 1 tablet (40 mg total) by mouth daily. 90 tablet 1   augmented betamethasone dipropionate (DIPROLENE-AF) 0.05 % cream Apply topically 2 (two) times daily as needed.     buPROPion (WELLBUTRIN XL) 150 MG 24 hr tablet Take 1 tablet (150 mg total) by mouth daily. 90 tablet 2   clopidogrel (PLAVIX) 75 MG tablet Take 1 tablet (75 mg total) by mouth daily. 90 tablet 0   mometasone (ELOCON) 0.1 % cream Apply topically 2 (two) times daily.     No current facility-administered medications for this visit.    PAST MEDICAL HISTORY: Past Medical  History:  Diagnosis Date   Allergy    Depression    Heart murmur    Hyperlipidemia    Hypertension     PAST SURGICAL HISTORY: Past Surgical History:  Procedure Laterality Date   LT knee lateral release  1981   RT knee lateral release  1994    FAMILY HISTORY: Family History  Problem Relation Age of Onset   Dementia Mother 67       (alzheimers)   Heart attack Father 45   Hypertension Father    Brain cancer Father    Cancer Other        lung/aunt/breast/cousins/mom and dads side   Depression Other        nephew    SOCIAL HISTORY: Social History   Socioeconomic History   Marital status: Married    Spouse name: Not on file   Number of children: 1   Years of education: some college   Highest education level: Not on file  Occupational History   Not on file  Tobacco Use    Smoking status: Former    Types: Cigarettes    Quit date: 12/24/2003    Years since quitting: 18.0   Smokeless tobacco: Never  Vaping Use   Vaping Use: Never used  Substance and Sexual Activity   Alcohol use: Not Currently    Comment: 01/21/22 -- one bottle of wine daily - cutting down to two glasses per day   Drug use: No   Sexual activity: Not on file  Other Topics Concern   Not on file  Social History Narrative   Banker for [employer], married, 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   Right-handed.   No daily caffeine use.   Social Determinants of Health   Financial Resource Strain: Not on file  Food Insecurity: Not on file  Transportation Needs: Not on file  Physical Activity: Not on file  Stress: Not on file  Social Connections: Not on file  Intimate Partner Violence: Not on file      Marcial Pacas, M.D. Ph.D.  [facility] [address] [place], [place] [address] Ph: [phone] Fax: [phone]  CC:  Luetta Nutting, [facility] [place]  [place],  [place] [address]  Luetta Nutting, DO

## 2022-01-23 DIAGNOSIS — G459 Transient cerebral ischemic attack, unspecified: Secondary | ICD-10-CM | POA: Diagnosis not present

## 2022-01-23 DIAGNOSIS — I4891 Unspecified atrial fibrillation: Secondary | ICD-10-CM

## 2022-01-29 ENCOUNTER — Ambulatory Visit: Payer: No Typology Code available for payment source | Admitting: Neurology

## 2022-01-29 DIAGNOSIS — R410 Disorientation, unspecified: Secondary | ICD-10-CM

## 2022-01-29 DIAGNOSIS — H538 Other visual disturbances: Secondary | ICD-10-CM

## 2022-02-04 ENCOUNTER — Ambulatory Visit: Payer: No Typology Code available for payment source | Admitting: Family Medicine

## 2022-02-07 ENCOUNTER — Encounter: Payer: Self-pay | Admitting: Family Medicine

## 2022-02-07 ENCOUNTER — Other Ambulatory Visit: Payer: Self-pay

## 2022-02-07 ENCOUNTER — Ambulatory Visit: Payer: No Typology Code available for payment source | Admitting: Family Medicine

## 2022-02-07 DIAGNOSIS — I10 Essential (primary) hypertension: Secondary | ICD-10-CM

## 2022-02-07 DIAGNOSIS — G459 Transient cerebral ischemic attack, unspecified: Secondary | ICD-10-CM | POA: Diagnosis not present

## 2022-02-07 DIAGNOSIS — E782 Mixed hyperlipidemia: Secondary | ICD-10-CM | POA: Diagnosis not present

## 2022-02-07 DIAGNOSIS — F324 Major depressive disorder, single episode, in partial remission: Secondary | ICD-10-CM | POA: Diagnosis not present

## 2022-02-07 NOTE — Assessment & Plan Note (Signed)
Blood pressure is better controlled.  He will continue on 81 mg aspirin and statin.

## 2022-02-07 NOTE — Assessment & Plan Note (Signed)
He will continue with Lexapro as well as bupropion.

## 2022-02-07 NOTE — Progress Notes (Signed)
Hector Chavez - 51 y.o. male MRN [id number]  Date of birth: [birth year]  Subjective Chief Complaint  Patient presents with   Hypertension    HPI Hector Chavez is a 51 year old male here today for follow-up of hypertension and TIA.  Blood pressure is much better controlled today.  Tolerating amlodipine well at current strength.  Evaluated by neurology recently as well.  Had EEG completed but has not received results.  Holter monitor did not show any arrhythmia.  He has not had any further episodes of vision change or dizziness.  Plavix was discontinued and he is only on 81 mg aspirin.  ROS:  A comprehensive ROS was completed and negative except as noted per HPI    Allergies  Allergen Reactions   Meloxicam Swelling    Caused feet/ankles to swell    Past Medical History:  Diagnosis Date   Allergy    Depression    Heart murmur    Hyperlipidemia    Hypertension     Past Surgical History:  Procedure Laterality Date   LT knee lateral release  1981   RT knee lateral release  1994    Social History   Socioeconomic History   Marital status: Married    Spouse name: Not on file   Number of children: 1   Years of education: some college   Highest education level: Not on file  Occupational History   Not on file  Tobacco Use   Smoking status: Former    Types: Cigarettes    Quit date: 12/24/2003    Years since quitting: 18.1   Smokeless tobacco: Never  Vaping Use   Vaping Use: Never used  Substance and Sexual Activity   Alcohol use: Not Currently    Comment: 01/21/22 -- one bottle of wine daily - cutting down to two glasses per day   Drug use: No   Sexual activity: Not on file  Other Topics Concern   Not on file  Social History Narrative   Production manager for [employer], married, 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   Right-handed.   No daily caffeine use.   Social Determinants of Health   Financial Resource Strain: Not on file  Food Insecurity: Not on file  Transportation  Needs: Not on file  Physical Activity: Not on file  Stress: Not on file  Social Connections: Not on file    Family History  Problem Relation Age of Onset   Dementia Mother 28       (alzheimers)   Heart attack Father 29   Hypertension Father    Brain cancer Father    Cancer Other        lung/aunt/breast/cousins/mom and dads side   Depression Other        nephew    Health Maintenance  Topic Date Due   HIV Screening  Never done   Hepatitis C Screening  Never done   COVID-19 Vaccine (3 - Booster for Pfizer series) 02/23/2022 (Originally 02/04/2021)   Zoster Vaccines- Shingrix (1 of 2) 05/07/2022 (Originally 04/19/2019)   TETANUS/TDAP  02/07/2023 (Originally 04/18/1988)   Fecal DNA (Cologuard)  04/25/2023   INFLUENZA VACCINE  Completed   HPV VACCINES  Aged Out     ----------------------------------------------------------------------------------------------------------------------------------------------------------------------------------------------------------------- Physical Exam BP 129/74 (BP Location: Left Arm, Patient Position: Sitting, Cuff Size: Large)    Pulse 64    Temp 97.9 F (36.6 C)    Ht 5\' 11"  (1.803 m)    Wt 288 lb (130.6 kg)  SpO2 99%    BMI 40.17 kg/m   Physical Exam Constitutional:      Appearance: Normal appearance.  Eyes:     General: No scleral icterus. Cardiovascular:     Rate and Rhythm: Normal rate and regular rhythm.  Pulmonary:     Effort: Pulmonary effort is normal.     Breath sounds: Normal breath sounds.  Musculoskeletal:     Cervical back: Neck supple.  Neurological:     General: No focal deficit present.     Mental Status: He is alert.  Psychiatric:        Mood and Affect: Mood normal.        Behavior: Behavior normal.     ------------------------------------------------------------------------------------------------------------------------------------------------------------------------------------------------------------------- Assessment and Plan  TIA (transient ischemic attack) Blood pressure is better controlled.  He will continue on 81 mg aspirin and statin.  Hypertension Blood pressure is well controlled at this time.  Recommend continuation of amlodipine at current strength.    Mixed hyperlipidemia Tolerating atorvastatin well with current at current strength.  MDD (major depressive disorder) He will continue with Lexapro as well as bupropion.   No orders of the defined types were placed in this encounter.   No follow-ups on file.    This visit occurred during the SARS-CoV-2 public health emergency.  Safety protocols were in place, including screening questions prior to the visit, additional usage of staff PPE, and extensive cleaning of exam room while observing appropriate contact time as indicated for disinfecting solutions.

## 2022-02-07 NOTE — Assessment & Plan Note (Signed)
Blood pressure is well-controlled at this time.  Recommend continuation of amlodipine at current strength. 

## 2022-02-07 NOTE — Assessment & Plan Note (Signed)
Tolerating atorvastatin well with current at current strength.

## 2022-02-14 NOTE — Procedures (Signed)
° °  HISTORY: 51 year old male complains of episode of sudden onset left-sided paresthesia, left lower vision loss9  TECHNIQUE:  This is a routine 16 channel EEG recording with one channel devoted to a limited EKG recording.  It was performed during wakefulness, drowsiness and asleep.  Hyperventilation and photic stimulation were performed as activating procedures.  There are minimum muscle and movement artifact noted.  Upon maximum arousal, posterior dominant waking rhythm consistent of rhythmic alpha range activity, with frequency of 9Hz . Activities are symmetric over the bilateral posterior derivations and attenuated with eye opening.  Hyperventilation produced mild/moderate buildup with higher amplitude and the slower activities noted.  Photic stimulation did not alter the tracing.  During EEG recording, patient developed drowsiness and no deeper stage of sleep was achieved During EEG recording, there was no epileptiform discharge noted.  EKG demonstrate sinus rhythm, with heart rate of 60 bpm  CONCLUSION: This is a  normal awake EEG.  There is no electrodiagnostic evidence of epileptiform discharge.  , M.D. Ph.D.  [facility] [address] [place], [place] Phone: [phone] Fax:      [phone]

## 2022-03-05 ENCOUNTER — Encounter: Payer: Self-pay | Admitting: Family Medicine

## 2022-03-16 ENCOUNTER — Other Ambulatory Visit: Payer: Self-pay | Admitting: Family Medicine

## 2022-03-20 ENCOUNTER — Ambulatory Visit: Payer: No Typology Code available for payment source | Admitting: Family Medicine

## 2022-03-20 ENCOUNTER — Other Ambulatory Visit: Payer: Self-pay

## 2022-03-20 ENCOUNTER — Encounter: Payer: Self-pay | Admitting: Family Medicine

## 2022-03-20 VITALS — BP 148/78 | HR 57 | Ht 71.0 in | Wt 285.0 lb

## 2022-03-20 DIAGNOSIS — F411 Generalized anxiety disorder: Secondary | ICD-10-CM | POA: Diagnosis not present

## 2022-03-20 DIAGNOSIS — E782 Mixed hyperlipidemia: Secondary | ICD-10-CM

## 2022-03-20 DIAGNOSIS — I10 Essential (primary) hypertension: Secondary | ICD-10-CM | POA: Diagnosis not present

## 2022-03-20 DIAGNOSIS — F324 Major depressive disorder, single episode, in partial remission: Secondary | ICD-10-CM | POA: Diagnosis not present

## 2022-03-20 MED ORDER — VALSARTAN-HYDROCHLOROTHIAZIDE 80-12.5 MG PO TABS: 1.0000 | ORAL_TABLET | Freq: Every day | ORAL | 3 refills | Status: DC

## 2022-03-20 NOTE — Assessment & Plan Note (Signed)
Blood pressure is elevated today.  Did not tolerate amlodipine well.  Adding valsartan/hydrochlorothiazide.  Return in approximately 2 weeks for blood pressure recheck and updated labs. ?

## 2022-03-20 NOTE — Patient Instructions (Signed)
Start valsartan/hctz  ?Return for nurse visit in 2 weeks to check BP ?Add lexapro back on.  ?See me again in 3 month.s  ?

## 2022-03-20 NOTE — Progress Notes (Signed)
?Hector Chavez - 51 y.o. male MRN [id number]  Date of birth: 1971-11-02 ? ?Subjective ?Chief Complaint  ?Patient presents with  ? Foot Swelling  ? ? ?HPI ?Phoenixx is a 51 year old male here today for follow-up of hypertension.  He has discontinued amlodipine due to bilateral lower extremity edema.  Blood pressure does remain elevated and he would willing to try something different for management of hypertension.  He has not had any increased symptoms including chest pain, shortness of breath, cough patient, headaches or vision changes. ? ?He has had increased stress related to work.  Currently taking bupropion.  He has also been prescribed Lexapro but is not currently taking.  He would be willing to add this on at this point. ? ?ROS:  A comprehensive ROS was completed and negative except as noted per HPI ? ?Allergies  ?Allergen Reactions  ? Meloxicam Swelling  ?  Caused feet/ankles to swell  ? ? ?Past Medical History:  ?Diagnosis Date  ? Allergy   ? Depression   ? Heart murmur   ? Hyperlipidemia   ? Hypertension   ? ? ?Past Surgical History:  ?Procedure Laterality Date  ? LT knee lateral release  1981  ? RT knee lateral release  1994  ? ? ?Social History  ? ?Socioeconomic History  ? Marital status: Married  ?  Spouse name: Not on file  ? Number of children: 1  ? Years of education: some college  ? Highest education level: Not on file  ?Occupational History  ? Not on file  ?Tobacco Use  ? Smoking status: Former  ?  Types: Cigarettes  ?  Quit date: 12/24/2003  ?  Years since quitting: 18.2  ? Smokeless tobacco: Never  ?Vaping Use  ? Vaping Use: Never used  ?Substance and Sexual Activity  ? Alcohol use: Not Currently  ?  Comment: 01/21/22 -- one bottle of wine daily - cutting down to two glasses per day  ? Drug use: No  ? Sexual activity: Not on file  ?Other Topics Concern  ? Not on file  ?Social History Narrative  ? Banker for [employer], married, 1 daughter.  ? 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  ? Right-handed.  ? No daily  caffeine use.  ? ?Social Determinants of Health  ? ?Financial Resource Strain: Not on file  ?Food Insecurity: Not on file  ?Transportation Needs: Not on file  ?Physical Activity: Not on file  ?Stress: Not on file  ?Social Connections: Not on file  ? ? ?Family History  ?Problem Relation Age of Onset  ? Dementia Mother 30  ?     (alzheimers)  ? Heart attack Father 45  ? Hypertension Father   ? Brain cancer Father   ? Cancer Other   ?     lung/aunt/breast/cousins/mom and dads side  ? Depression Other   ?     nephew  ? ? ?Health Maintenance  ?Topic Date Due  ? Zoster Vaccines- Shingrix (1 of 2) 05/07/2022 (Originally 04/19/2019)  ? COVID-19 Vaccine (3 - Booster for Pfizer series) 08/23/2022 (Originally 02/04/2021)  ? TETANUS/TDAP  02/07/2023 (Originally 04/18/1988)  ? Hepatitis C Screening  03/21/2023 (Originally 04/19/1987)  ? HIV Screening  03/21/2023 (Originally 04/18/1984)  ? Fecal DNA (Cologuard)  04/25/2023  ? INFLUENZA VACCINE  Completed  ? HPV VACCINES  Aged Out  ? ? ? ?----------------------------------------------------------------------------------------------------------------------------------------------------------------------------------------------------------------- ?Physical Exam ?BP (!) 148/78 (BP Location: Left Arm, Patient Position: Sitting, Cuff Size: Large)   Pulse (!) 57   Ht  5\' 11"  (1.803 m)   Wt 285 lb (129.3 kg)   SpO2 98%   BMI 39.75 kg/m?  ? ?Physical Exam ?Constitutional:   ?   Appearance: Normal appearance.  ?Eyes:  ?   General: No scleral icterus. ?Cardiovascular:  ?   Rate and Rhythm: Normal rate and regular rhythm.  ?Pulmonary:  ?   Effort: Pulmonary effort is normal.  ?   Breath sounds: Normal breath sounds.  ?Musculoskeletal:  ?   Cervical back: Neck supple.  ?Neurological:  ?   General: No focal deficit present.  ?   Mental Status: He is alert.  ?Psychiatric:     ?   Mood and Affect: Mood normal.     ?   Behavior: Behavior normal.   ? ? ?------------------------------------------------------------------------------------------------------------------------------------------------------------------------------------------------------------------- ?Assessment and Plan ? ?Hypertension ?Blood pressure is elevated today.  Did not tolerate amlodipine well.  Adding valsartan/hydrochlorothiazide.  Return in approximately 2 weeks for blood pressure recheck and updated labs. ? ?GAD (generalized anxiety disorder) ?He has had increased anxiety.  Adding Lexapro back on.  He does have plenty of this at home.  In addition he will continue bupropion at current strength. ? ? ?Meds ordered this encounter  ?Medications  ? valsartan-hydrochlorothiazide (DIOVAN-HCT) 80-12.5 MG tablet  ?  Sig: Take 1 tablet by mouth daily.  ?  Dispense:  90 tablet  ?  Refill:  3  ? ? ?Return in about 3 months (around 06/20/2022) for HTN. ? ? ? ?This visit occurred during the SARS-CoV-2 public health emergency.  Safety protocols were in place, including screening questions prior to the visit, additional usage of staff PPE, and extensive cleaning of exam room while observing appropriate contact time as indicated for disinfecting solutions.  ? ?

## 2022-03-20 NOTE — Assessment & Plan Note (Signed)
He has had increased anxiety.  Adding Lexapro back on.  He does have plenty of this at home.  In addition he will continue bupropion at current strength. ?

## 2022-04-04 ENCOUNTER — Ambulatory Visit: Payer: No Typology Code available for payment source

## 2022-04-16 ENCOUNTER — Other Ambulatory Visit: Payer: Self-pay | Admitting: Family Medicine

## 2022-05-01 ENCOUNTER — Ambulatory Visit: Payer: No Typology Code available for payment source | Admitting: Family Medicine

## 2022-05-21 ENCOUNTER — Encounter: Payer: Self-pay | Admitting: Family Medicine

## 2022-05-22 MED ORDER — ATORVASTATIN CALCIUM 40 MG PO TABS: 40.0000 mg | ORAL_TABLET | Freq: Every day | ORAL | 0 refills | Status: DC

## 2022-05-27 ENCOUNTER — Encounter: Payer: Self-pay | Admitting: Family Medicine

## 2022-05-27 ENCOUNTER — Ambulatory Visit: Payer: No Typology Code available for payment source | Admitting: Family Medicine

## 2022-05-27 VITALS — BP 166/100 | HR 78 | Ht 71.0 in | Wt 278.0 lb

## 2022-05-27 DIAGNOSIS — H539 Unspecified visual disturbance: Secondary | ICD-10-CM | POA: Diagnosis not present

## 2022-05-27 DIAGNOSIS — R202 Paresthesia of skin: Secondary | ICD-10-CM | POA: Diagnosis not present

## 2022-05-27 DIAGNOSIS — E782 Mixed hyperlipidemia: Secondary | ICD-10-CM

## 2022-05-27 DIAGNOSIS — I10 Essential (primary) hypertension: Secondary | ICD-10-CM

## 2022-05-27 NOTE — Progress Notes (Signed)
Chief Complaint  Patient presents with   Follow-up    Rm 10, alone. Pt reports no change in sx. Pt has noticed issues w/ word finding and remembering certain things.     HISTORY OF PRESENT ILLNESS:  05/27/22 ALL:  Hector Chavez is a 51 y.o. male here today for follow up for two separate events of transient vision changes (left lower field), dizziness, generalized weakness and left sided tingling. EEG was normal. MRI brain normal. CTA no large vessel disease. Zio patch unremarkable. LDL 124. He was started on Plavix and asa but advised asa 81mg  alone at consult with Dr Krista Blue 12/2021.   He reports that he has not had any further transient episodes. He continues atorvastatin 40mg  daily. He did receive a refill of Plavix through PCP but did not restart. He rhas not checked BP at home. Reading was elevagted at last PCP visit 02/2021 and he was started on valasartan-HCTZ 80-12.5mg  daily. He admits that he did not take meds this morning.   He feels that stress levels are about the same. He works full time in Engineer, mining. He continues Wellbutrin per PCP. He reports that his wife asked him to mention concerns with difficulty finding words at times. No confusion. He has lost around 20 pounds. He has been watching what he is eating. He continues to drink about 4 glasses of wine every night.    HISTORY (copied from Dr Rhea Belton previous note)  Hector Chavez is a 51 year old male, accompanied by his wife, seen in request by his primary care physician Dr. Zigmund Daniel, [facility] for evaluation of sudden onset visual change, left arm numbness, initial evaluation was on January 21, 2022.   I reviewed and summarized the referring note.  Past medical history Hypertension Hyperlipidemia Depression   Patient suffered a lot of work-related depression anxiety, started to receive Wellbutrin few months ago, still dealing with some mood disorder, he also has a history of migraine, but denies a history of visual with his migraine in  the past  On January 03, 2022 at evening while watching TV, when he stands up going to the kitchen, he suddenly noticed vision change, involving left lower visual field, he close left eye, stated to right eye vision is normal, but at the left bottom vision, he noticed black and white kaleidoscope imaging, at the same time he felt dizziness, unsteady sensation, tingling in his left arm, he has to grab countertop to keep himself steady, the left lower vision eventually went black, lasting for 5 minutes, gradually recovered, he denies headache, he did report heart palpitation, thought it was due to nervousness from his sudden visual loss  Wife also reported few episode of confusion episode over the past few months, he forgot he has changed bed sheets, there is another incident he forgot he has put down the Christmas preparation.   He was seen by ophthalmologist Dr. Briscoe Deutscher. Clide Dales recently, there was no significant abnormality found  On January 23.  2023, he was on his appointment for echocardiogram, in a standing position, he had sudden onset of dizziness, unsteady sensation, he has to sit down, felt left arm, left foot numbness, mild confusion, no visual change, no headache  He does drink wine up to 20 ounce daily  He  extensive evaluation in January 2023, MRI of the brain reviewed with patient, no significant abnormality, CT angiogram of head showed no large vessel disease, ultrasound of carotid arteries showed no significant abnormality.   Echocardiogram was within normal  limit, he has cardiac monitoring pending   REVIEW OF SYSTEMS: Out of a complete 14 system review of symptoms, the patient complains only of the following symptoms, anxiety, word finding difficulty, and all other reviewed systems are negative.   ALLERGIES: Allergies  Allergen Reactions   Meloxicam Swelling    Caused feet/ankles to swell     HOME MEDICATIONS: Outpatient Medications Prior to Visit  Medication Sig  Dispense Refill   aspirin EC 81 MG tablet Take 81 mg by mouth daily. Swallow whole.     atorvastatin (LIPITOR) 40 MG tablet Take 1 tablet (40 mg total) by mouth daily. 90 tablet 0   augmented betamethasone dipropionate (DIPROLENE-AF) 0.05 % cream Apply topically 2 (two) times daily as needed.     buPROPion (WELLBUTRIN XL) 150 MG 24 hr tablet TAKE 1 TABLET BY MOUTH EVERY DAY 90 tablet 2   mometasone (ELOCON) 0.1 % cream Apply topically 2 (two) times daily.     valsartan-hydrochlorothiazide (DIOVAN-HCT) 80-12.5 MG tablet Take 1 tablet by mouth daily. 90 tablet 3   clopidogrel (PLAVIX) 75 MG tablet TAKE 1 TABLET BY MOUTH EVERY DAY 90 tablet 0   No facility-administered medications prior to visit.     PAST MEDICAL HISTORY: Past Medical History:  Diagnosis Date   Allergy    Depression    Heart murmur    Hyperlipidemia    Hypertension      PAST SURGICAL HISTORY: Past Surgical History:  Procedure Laterality Date   LT knee lateral release  1981   RT knee lateral release  1994     FAMILY HISTORY: Family History  Problem Relation Age of Onset   Dementia Mother 52       (alzheimers)   Heart attack Father 34   Hypertension Father    Brain cancer Father    Cancer Other        lung/aunt/breast/cousins/mom and dads side   Depression Other        nephew     SOCIAL HISTORY: Social History   Socioeconomic History   Marital status: Married    Spouse name: Not on file   Number of children: 1   Years of education: some college   Highest education level: Not on file  Occupational History   Not on file  Tobacco Use   Smoking status: Former    Types: Cigarettes    Quit date: 12/24/2003    Years since quitting: 18.4   Smokeless tobacco: Never  Vaping Use   Vaping Use: Never used  Substance and Sexual Activity   Alcohol use: Not Currently    Comment: 01/21/22 -- one bottle of wine daily - cutting down to two glasses per day   Drug use: No   Sexual activity: Not on file   Other Topics Concern   Not on file  Social History Narrative   Banker for [employer], married, 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   Right-handed.   No daily caffeine use.   Social Determinants of Health   Financial Resource Strain: Not on file  Food Insecurity: Not on file  Transportation Needs: Not on file  Physical Activity: Not on file  Stress: Not on file  Social Connections: Not on file  Intimate Partner Violence: Not on file     PHYSICAL EXAM  Vitals:   05/27/22 1308  BP: (!) 166/100  Pulse: 78  Weight: 278 lb (126.1 kg)  Height: 5\' 11"  (1.803 m)   Body mass index  is 38.77 kg/m.  Generalized: Well developed, in no acute distress  Cardiology: normal rate and rhythm, no murmur auscultated  Respiratory: clear to auscultation bilaterally    Neurological examination  Mentation: Alert oriented to time, place, history taking. Follows all commands speech and language fluent Cranial nerve II-XII: Pupils were equal round reactive to light. Extraocular movements were full, visual field were full on confrontational test. Facial sensation and strength were normal. Head turning and shoulder shrug  were normal and symmetric. Motor: The motor testing reveals 5 over 5 strength of all 4 extremities. Good symmetric motor tone is noted throughout.  Sensory: Sensory testing is intact to soft touch on all 4 extremities. No evidence of extinction is noted.  Coordination: Cerebellar testing reveals good finger-nose-finger  Gait and station: Gait is normal.    DIAGNOSTIC DATA (LABS, IMAGING, TESTING) - I reviewed patient records, labs, notes, testing and imaging myself where available.  Lab Results  Component Value Date   WBC 6.9 05/17/2021   HGB 16.6 05/17/2021   HCT 49.7 05/17/2021   MCV 91.2 05/17/2021   PLT 246 05/17/2021      Component Value Date/Time   NA 143 01/04/2022 0000   K 4.7 01/04/2022 0000   CL 104 01/04/2022 0000   CO2 30 01/04/2022 0000    GLUCOSE 116 (H) 01/04/2022 0000   BUN 12 01/04/2022 0000   CREATININE 1.21 01/04/2022 0000   CALCIUM 10.0 01/04/2022 0000   PROT 7.7 01/04/2022 0000   ALBUMIN 4.7 01/30/2017 1025   AST 42 (H) 01/04/2022 0000   ALT 62 (H) 01/04/2022 0000   ALKPHOS 54 01/30/2017 1025   BILITOT 0.6 01/04/2022 0000   GFRNONAA 78 05/17/2021 0000   GFRAA 90 05/17/2021 0000   Lab Results  Component Value Date   CHOL 206 (H) 01/04/2022   HDL 52 01/04/2022   LDLCALC 124 (H) 01/04/2022   TRIG 182 (H) 01/04/2022   CHOLHDL 4.0 01/04/2022   Lab Results  Component Value Date   HGBA1C 5.2 01/30/2017   No results found for: [id number] Lab Results  Component Value Date   TSH 1.68 03/23/2020        View : No data to display.               View : No data to display.           ASSESSMENT AND PLAN  51 y.o. year old male  has a past medical history of Allergy, Depression, Heart murmur, Hyperlipidemia, and Hypertension. here with    Transient vision disturbance, left  Mixed hyperlipidemia  Hypertension, unspecified type  Paresthesia  Miranda Mcelhinny denies any further transient visual or sensory symptoms since last visit. We have discussed previous workup. I have reviewed stroke prevention goals. BP is elevated, today. He was encouraged to take his HTN agent asap. He was encouraged to check BP regularly at home until BP is well managed on medications. He will continue atorvastatin 40mg  mg and asa 81mg  daily. He was encouraged to decrease alcohol intake to less than 2 drinks per day. He was commended for weight loss efforts and advised to continue working toward goal of BMI 20-25. He reports using CPAP nightly but has not had follow up with provider since set up. May consider revisit to assess apnea management. He will follow up with PCP as directed. He will return to see me as needed. He verbalizes understanding and agreement with this plan.    No orders of the defined types  were placed in this  encounter.    No orders of the defined types were placed in this encounter.    Debbora Presto, MSN, FNP-C 05/27/2022, 1:56 PM  [facility] [address] [place], [place] [address] [phone]

## 2022-05-27 NOTE — Patient Instructions (Addendum)
Below is our plan:  We will continue to monitor symptoms. Keep an eye on your blood pressure at home.   Goals: BP less than 130/90, your reading today is 166/100 (manually 180 sys) LDL less than 70, last LDL result 124 BMI 20-25, currently 38.77  Please make sure you are staying well hydrated. I recommend 50-60 ounces daily. Well balanced diet and regular exercise encouraged. Consistent sleep schedule with 6-8 hours recommended. I would encourage you to limit alcohol to less than 2 drinks nightly.   Please continue follow up with care team as directed.   Follow up with me as needed   You may receive a survey regarding today's visit. I encourage you to leave honest feed back as I do use this information to improve patient care. Thank you for seeing me today!    Management of Memory Problems   There are some general things you can do to help manage your memory problems.  Your memory may not in fact recover, but by using techniques and strategies you will be able to manage your memory difficulties better.   1)  Establish a routine. Try to establish and then stick to a regular routine.  By doing this, you will get used to what to expect and you will reduce the need to rely on your memory.  Also, try to do things at the same time of day, such as taking your medication or checking your calendar first thing in the morning. Think about think that you can do as a part of a regular routine and make a list.  Then enter them into a daily planner to remind you.  This will help you establish a routine.   2)  Organize your environment. Organize your environment so that it is uncluttered.  Decrease visual stimulation.  Place everyday items such as keys or cell phone in the same place every day (ie.  Basket next to front door) Use post it notes with a brief message to yourself (ie. Turn off light, lock the door) Use labels to indicate where things go (ie. Which cupboards are for food, dishes, etc.) Keep  a notepad and pen by the telephone to take messages   3)  Memory Aids A diary or journal/notebook/daily planner Making a list (shopping list, chore list, to do list that needs to be done) Using an alarm as a reminder (kitchen timer or cell phone alarm) Using cell phone to store information (Notes, Calendar, Reminders) Calendar/White board placed in a prominent position Post-it notes   In order for memory aids to be useful, you need to have good habits.  It's no good remembering to make a note in your journal if you don't remember to look in it.  Try setting aside a certain time of day to look in journal.   4)  Improving mood and managing fatigue. There may be other factors that contribute to memory difficulties.  Factors, such as anxiety, depression and tiredness can affect memory. Regular gentle exercise can help improve your mood and give you more energy. Simple relaxation techniques may help relieve symptoms of anxiety Try to get back to completing activities or hobbies you enjoyed doing in the past. Learn to pace yourself through activities to decrease fatigue. Find out about some local support groups where you can share experiences with others. Try and achieve 7-8 hours of sleep at night.

## 2022-06-18 ENCOUNTER — Encounter: Payer: Self-pay | Admitting: Family Medicine

## 2022-06-18 ENCOUNTER — Ambulatory Visit: Payer: No Typology Code available for payment source | Admitting: Family Medicine

## 2022-06-18 VITALS — BP 125/78 | HR 69 | Ht 71.0 in | Wt 275.0 lb

## 2022-06-18 DIAGNOSIS — I10 Essential (primary) hypertension: Secondary | ICD-10-CM

## 2022-06-18 DIAGNOSIS — E782 Mixed hyperlipidemia: Secondary | ICD-10-CM

## 2022-06-18 DIAGNOSIS — F411 Generalized anxiety disorder: Secondary | ICD-10-CM | POA: Diagnosis not present

## 2022-06-18 DIAGNOSIS — F324 Major depressive disorder, single episode, in partial remission: Secondary | ICD-10-CM

## 2022-06-18 MED ORDER — ATORVASTATIN CALCIUM 40 MG PO TABS: 40.0000 mg | ORAL_TABLET | Freq: Every day | ORAL | 1 refills | Status: DC

## 2022-06-18 MED ORDER — VALSARTAN-HYDROCHLOROTHIAZIDE 160-25 MG PO TABS: 1.0000 | ORAL_TABLET | Freq: Every day | ORAL | 3 refills | Status: DC

## 2022-06-18 NOTE — Assessment & Plan Note (Signed)
BP elevated.  Increasing valsartan/hctz to 160/25mg .  Return in 2 weeks for nurse visit.

## 2022-06-18 NOTE — Assessment & Plan Note (Signed)
Tolerating atorvastatin well.  Update lipid panel.  

## 2022-06-19 ENCOUNTER — Other Ambulatory Visit: Payer: Self-pay | Admitting: Family Medicine

## 2022-06-19 LAB — COMPLETE METABOLIC PANEL WITH GFR
AG Ratio: 1.6 (calc) (ref 1.0–2.5)
ALT: 45 U/L (ref 9–46)
AST: 33 U/L (ref 10–35)
Albumin: 4.7 g/dL (ref 3.6–5.1)
Alkaline phosphatase (APISO): 65 U/L (ref 35–144)
BUN: 14 mg/dL (ref 7–25)
CO2: 27 mmol/L (ref 20–32)
Calcium: 9.8 mg/dL (ref 8.6–10.3)
Chloride: 104 mmol/L (ref 98–110)
Creat: 1.17 mg/dL (ref 0.70–1.30)
Globulin: 2.9 g/dL (calc) (ref 1.9–3.7)
Glucose, Bld: 112 mg/dL — ABNORMAL HIGH (ref 65–99)
Potassium: 4.3 mmol/L (ref 3.5–5.3)
Sodium: 143 mmol/L (ref 135–146)
Total Bilirubin: 0.5 mg/dL (ref 0.2–1.2)
Total Protein: 7.6 g/dL (ref 6.1–8.1)
eGFR: 75 mL/min/{1.73_m2} (ref >=60)

## 2022-06-19 LAB — LIPID PANEL W/REFLEX DIRECT LDL
Cholesterol: 208 mg/dL — ABNORMAL HIGH (ref <200)
HDL: 54 mg/dL (ref >=40)
LDL Cholesterol (Calc): 129 mg/dL (calc) — ABNORMAL HIGH
Non-HDL Cholesterol (Calc): 154 mg/dL (calc) — ABNORMAL HIGH (ref <130)
Total CHOL/HDL Ratio: 3.9 (calc) (ref <5.0)
Triglycerides: 131 mg/dL (ref <150)

## 2022-06-19 MED ORDER — ATORVASTATIN CALCIUM 80 MG PO TABS: 80.0000 mg | ORAL_TABLET | Freq: Every day | ORAL | 1 refills | Status: DC

## 2022-06-23 ENCOUNTER — Other Ambulatory Visit: Payer: Self-pay | Admitting: Family Medicine

## 2022-06-24 ENCOUNTER — Telehealth: Payer: Self-pay | Admitting: General Practice

## 2022-06-24 NOTE — Telephone Encounter (Signed)
Transition Care Management Follow-up Telephone Call Date of discharge and from where: 06/23/22 from [facility] How have you been since you were released from the hospital? Still has a headache and still doesn't feel right. He will call neurology for an appointment. Any questions or concerns? No  Items Reviewed: Did the pt receive and understand the discharge instructions provided? Yes  Medications obtained and verified? No  Other? No  Any new allergies since your discharge? No  Dietary orders reviewed? Yes Do you have support at home? Yes   Home Care and Equipment/Supplies: Were home health services ordered? no  Functional Questionnaire: (I = Independent and D = Dependent) ADLs: I  Bathing/Dressing- I  Meal Prep- I  Eating- I  Maintaining continence- I  Transferring/Ambulation- I  Managing Meds- I  Follow up appointments reviewed:  PCP Hospital f/u appt confirmed? No  He will call back for an appointment. Specialist Hospital f/u appt confirmed? No   Are transportation arrangements needed? No  If their condition worsens, is the pt aware to call PCP or go to the Emergency Dept.? Yes Was the patient provided with contact information for the PCP's office or ED? Yes Was to pt encouraged to call back with questions or concerns? Yes

## 2022-07-04 ENCOUNTER — Ambulatory Visit (INDEPENDENT_AMBULATORY_CARE_PROVIDER_SITE_OTHER): Payer: No Typology Code available for payment source | Admitting: Family Medicine

## 2022-07-04 VITALS — BP 113/63 | HR 80 | Temp 98.2°F | Ht 71.0 in | Wt 271.0 lb

## 2022-07-04 DIAGNOSIS — I10 Essential (primary) hypertension: Secondary | ICD-10-CM

## 2022-07-04 NOTE — Progress Notes (Signed)
Medical screening examination/treatment was performed by qualified clinical staff member and as supervising physician I was immediately available for consultation/collaboration. I have reviewed documentation and agree with assessment and plan.  Genessis Flanary, DO  

## 2022-07-04 NOTE — Progress Notes (Signed)
Patient is here for blood pressure check. Denies trouble sleeping, palpitations, dizziness, lightheadedness, blurry vision, chest pain, shortness of breath, headaches and/or medication problems.   Patient's blood pressure was within goal range. Provider notified of current blood pressure reading.   Patient informed to schedule next appointment as instructed by provider.

## 2022-07-08 ENCOUNTER — Ambulatory Visit: Payer: No Typology Code available for payment source | Admitting: Family Medicine

## 2022-07-08 ENCOUNTER — Encounter: Payer: Self-pay | Admitting: Family Medicine

## 2022-07-08 VITALS — BP 144/75 | HR 75 | Ht 71.0 in | Wt 273.0 lb

## 2022-07-08 DIAGNOSIS — I10 Essential (primary) hypertension: Secondary | ICD-10-CM | POA: Diagnosis not present

## 2022-07-08 DIAGNOSIS — E782 Mixed hyperlipidemia: Secondary | ICD-10-CM

## 2022-07-08 DIAGNOSIS — I280 Arteriovenous fistula of pulmonary vessels: Secondary | ICD-10-CM | POA: Insufficient documentation

## 2022-07-08 DIAGNOSIS — G459 Transient cerebral ischemic attack, unspecified: Secondary | ICD-10-CM | POA: Diagnosis not present

## 2022-07-08 DIAGNOSIS — F324 Major depressive disorder, single episode, in partial remission: Secondary | ICD-10-CM

## 2022-07-08 DIAGNOSIS — IMO0002 Reserved for concepts with insufficient information to code with codable children: Secondary | ICD-10-CM

## 2022-07-08 MED ORDER — HYDROXYZINE PAMOATE 25 MG PO CAPS: 25.0000 mg | ORAL_CAPSULE | Freq: Three times a day (TID) | ORAL | 0 refills | Status: DC | PRN

## 2022-07-08 NOTE — Assessment & Plan Note (Signed)
Possible right to left cardiac shunt.  Given recurrence of symptoms a cardiac MRI has been ordered

## 2022-07-08 NOTE — Assessment & Plan Note (Signed)
Continue atorvastatin at current strength.  

## 2022-07-08 NOTE — Assessment & Plan Note (Signed)
He has had multiple TIAs.  Currently on Plavix and aspirin.  Has follow-up with neurology later this month.  He has not had any findings on MRI.  Alternatively he could be having complex migraines causing his symptoms.

## 2022-07-08 NOTE — Assessment & Plan Note (Signed)
He will let me know which strength of Lexapro is currently taking.  Continue bupropion at current strength.  Referral placed for therapy.

## 2022-07-08 NOTE — Progress Notes (Signed)
Hector Chavez - 51 y.o. male MRN [id number]  Date of birth: 1971-02-04  Subjective Chief Complaint  Patient presents with   Hospitalization Follow-up    HPI Hector Chavez is a 51 year old male here today for hospital follow-up.  He is accompanied by his wife who provides some of the history today.  Seen in the ED recently for strokelike symptoms.  Had left-sided weakness with left-sided facial droop.  Also noted to have some dysarthria.  Admitted for stroke work-up.  MRI did not show any new changes.  Echocardiogram did show possible right to left shunt on bubble study.  Recommended consideration of cardiac MRI.  Lisinopril would not let the kids repentant.  To 2 weeks later growth symptoms did resolve.  He was continued on aspirin statin.  Clopidogrel was added.  Since discharge he has continued to have some fatigue.  Denies any further weakness.  Walking some for exercise but does not feel that his endurance is the same.   He has been under quite a bit of stress related to his job.  Reports that he continues to take Lexapro and bupropion regularly.  He is not sure what strength of Lexapro he is currently taking.  He would be interested in seeing his therapist.  His wife report that he has cut back significantly on his alcohol intake.  ROS:  A comprehensive ROS was completed and negative except as noted per HPI    Allergies  Allergen Reactions   Meloxicam Swelling    Caused feet/ankles to swell    Past Medical History:  Diagnosis Date   Allergy    Depression    Heart murmur    Hyperlipidemia    Hypertension     Past Surgical History:  Procedure Laterality Date   LT knee lateral release  1981   RT knee lateral release  1994    Social History   Socioeconomic History   Marital status: Married    Spouse name: Not on file   Number of children: 1   Years of education: some college   Highest education level: Not on file  Occupational History   Not on file  Tobacco Use   Smoking  status: Former    Types: Cigarettes    Quit date: 12/24/2003    Years since quitting: 18.5   Smokeless tobacco: Never  Vaping Use   Vaping Use: Never used  Substance and Sexual Activity   Alcohol use: Not Currently    Comment: 01/21/22 -- one bottle of wine daily - cutting down to two glasses per day   Drug use: No   Sexual activity: Not on file  Other Topics Concern   Not on file  Social History Narrative   Production manager for [employer], married, 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   Right-handed.   No daily caffeine use.   Social Determinants of Health   Financial Resource Strain: Not on file  Food Insecurity: Not on file  Transportation Needs: Not on file  Physical Activity: Not on file  Stress: Not on file  Social Connections: Not on file    Family History  Problem Relation Age of Onset   Dementia Mother 45       (alzheimers)   Heart attack Father 38   Hypertension Father    Brain cancer Father    Cancer Other        lung/aunt/breast/cousins/mom and dads side   Depression Other        nephew  Health Maintenance  Topic Date Due   COVID-19 Vaccine (3 - Pfizer series) 08/23/2022 (Originally 02/04/2021)   Zoster Vaccines- Shingrix (1 of 2) 09/18/2022 (Originally 04/19/2019)   TETANUS/TDAP  02/07/2023 (Originally 04/18/1988)   Hepatitis C Screening  03/21/2023 (Originally 04/19/1987)   HIV Screening  03/21/2023 (Originally 04/18/1984)   INFLUENZA VACCINE  07/23/2022   Fecal DNA (Cologuard)  04/25/2023   HPV VACCINES  Aged Out     ----------------------------------------------------------------------------------------------------------------------------------------------------------------------------------------------------------------- Physical Exam BP (!) 144/75 (BP Location: Left Arm, Patient Position: Sitting, Cuff Size: Large)   Pulse 75   Ht 5\' 11"  (1.803 m)   Wt 273 lb (123.8 kg)   SpO2 99%   BMI 38.08 kg/m   Physical Exam Constitutional:       Appearance: Normal appearance.  Eyes:     General: No scleral icterus. Cardiovascular:     Rate and Rhythm: Normal rate and regular rhythm.  Pulmonary:     Effort: Pulmonary effort is normal.     Breath sounds: Normal breath sounds.  Musculoskeletal:     Cervical back: Neck supple.  Neurological:     General: No focal deficit present.     Mental Status: He is alert and oriented to person, place, and time.     Cranial Nerves: No cranial nerve deficit.     Motor: No weakness.     Gait: Gait normal.  Psychiatric:        Mood and Affect: Mood normal.        Behavior: Behavior normal.     ------------------------------------------------------------------------------------------------------------------------------------------------------------------------------------------------------------------- Assessment and Plan  Hypertension Mild elevation of blood pressure today, improved on recheck.  Advised to check blood pressures at home.  TIA (transient ischemic attack) He has had multiple TIAs.  Currently on Plavix and aspirin.  Has follow-up with neurology later this month.  He has not had any findings on MRI.  Alternatively he could be having complex migraines causing his symptoms.  Hyperlipidemia Continue atorvastatin at current strength.  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  Given recurrence of symptoms a cardiac MRI has been ordered  MDD (major depressive disorder) He will let me know which strength of Lexapro is currently taking.  Continue bupropion at current strength.  Referral placed for therapy.   Meds ordered this encounter  Medications   hydrOXYzine (VISTARIL) 25 MG capsule    Sig: Take 1 capsule (25 mg total) by mouth 3 (three) times daily as needed for anxiety.    Dispense:  30 capsule    Refill:  0    Return in about 4 weeks (around 08/05/2022) for TIA.    This visit occurred during the SARS-CoV-2 public health emergency.   Safety protocols were in place, including screening questions prior to the visit, additional usage of staff PPE, and extensive cleaning of exam room while observing appropriate contact time as indicated for disinfecting solutions.

## 2022-07-08 NOTE — Assessment & Plan Note (Signed)
Mild elevation of blood pressure today, improved on recheck.  Advised to check blood pressures at home.

## 2022-07-08 NOTE — Patient Instructions (Addendum)
We'll be in touch with lab results.  Let's remain out of work for now.  I have ordered an MRI for your heart.  You will be contacted to schedule.   Let me know dose of lexapro (escitalopram).  Try hydroxyzine as needed for breakthrough anxiety.   Monitor BP at home.

## 2022-07-09 ENCOUNTER — Encounter: Payer: Self-pay | Admitting: Family Medicine

## 2022-07-09 LAB — CBC WITH DIFFERENTIAL/PLATELET
Absolute Monocytes: 561 cells/uL (ref 200–950)
Basophils Absolute: 50 cells/uL (ref 0–200)
Basophils Relative: 0.7 %
Eosinophils Absolute: 220 cells/uL (ref 15–500)
Eosinophils Relative: 3.1 %
HCT: 45.9 % (ref 38.5–50.0)
Hemoglobin: 15.8 g/dL (ref 13.2–17.1)
Lymphs Abs: 1839 cells/uL (ref 850–3900)
MCH: 31 pg (ref 27.0–33.0)
MCHC: 34.4 g/dL (ref 32.0–36.0)
MCV: 90.2 fL (ref 80.0–100.0)
MPV: 9.6 fL (ref 7.5–12.5)
Monocytes Relative: 7.9 %
Neutro Abs: 4430 cells/uL (ref 1500–7800)
Neutrophils Relative %: 62.4 %
Platelets: 248 10*3/uL (ref 140–400)
RBC: 5.09 10*6/uL (ref 4.20–5.80)
RDW: 13 % (ref 11.0–15.0)
Total Lymphocyte: 25.9 %
WBC: 7.1 10*3/uL (ref 3.8–10.8)

## 2022-07-09 LAB — COMPLETE METABOLIC PANEL WITH GFR
AG Ratio: 1.8 (calc) (ref 1.0–2.5)
ALT: 45 U/L (ref 9–46)
AST: 31 U/L (ref 10–35)
Albumin: 4.9 g/dL (ref 3.6–5.1)
Alkaline phosphatase (APISO): 60 U/L (ref 35–144)
BUN: 15 mg/dL (ref 7–25)
CO2: 27 mmol/L (ref 20–32)
Calcium: 9.6 mg/dL (ref 8.6–10.3)
Chloride: 104 mmol/L (ref 98–110)
Creat: 1.04 mg/dL (ref 0.70–1.30)
Globulin: 2.7 g/dL (calc) (ref 1.9–3.7)
Glucose, Bld: 99 mg/dL (ref 65–99)
Potassium: 4 mmol/L (ref 3.5–5.3)
Sodium: 142 mmol/L (ref 135–146)
Total Bilirubin: 0.7 mg/dL (ref 0.2–1.2)
Total Protein: 7.6 g/dL (ref 6.1–8.1)
eGFR: 86 mL/min/{1.73_m2} (ref >=60)

## 2022-07-09 MED ORDER — ESCITALOPRAM OXALATE 20 MG PO TABS: 20.0000 mg | ORAL_TABLET | Freq: Every day | ORAL | 1 refills | Status: DC

## 2022-07-15 ENCOUNTER — Encounter: Payer: Self-pay | Admitting: Family Medicine

## 2022-07-19 ENCOUNTER — Telehealth: Payer: Self-pay

## 2022-07-19 ENCOUNTER — Encounter: Payer: Self-pay | Admitting: Family Medicine

## 2022-07-19 ENCOUNTER — Other Ambulatory Visit: Payer: Self-pay | Admitting: Family Medicine

## 2022-07-19 DIAGNOSIS — Z8673 Personal history of transient ischemic attack (TIA), and cerebral infarction without residual deficits: Secondary | ICD-10-CM | POA: Insufficient documentation

## 2022-07-19 MED ORDER — TESTOSTERONE 50 MG/5GM (1%) TD GEL: 5.0000 g | Freq: Every day | TRANSDERMAL | 2 refills | Status: DC

## 2022-07-19 NOTE — Telephone Encounter (Signed)
Disability/ Medical Form completed by Dr. Ashley Royalty and given to Front Desk to scan.   Pt has been notified via MyChart msg.   Thanks

## 2022-07-19 NOTE — Telephone Encounter (Signed)
Scanned to patients chart and patient picked forms up. AMUCK

## 2022-08-02 ENCOUNTER — Encounter: Payer: Self-pay | Admitting: Psychology

## 2022-08-02 NOTE — Progress Notes (Signed)
This encounter was created in error - please disregard.

## 2022-08-06 ENCOUNTER — Ambulatory Visit: Payer: No Typology Code available for payment source | Admitting: Family Medicine

## 2022-08-08 ENCOUNTER — Telehealth: Payer: Self-pay

## 2022-08-08 NOTE — Telephone Encounter (Addendum)
Initiated Prior authorization PPJ:[id number] 50 MG/5GM(1%) gel Via: Covermymeds Case/Key:[id number] Status: denied  as of 08/08/22 Reason:Coverage for this medication is denied for the following reason(s). We reviewed the information we received about your condition and circumstances. We used plan approved criteria when making this decision. The policy states that this medication may be approved when: -The member is unable to take the required number of formulary alternatives for the given diagnosis due to an intolerance or contraindication OR -The member has tried and failed the required number of formulary alternatives. Based on the policy and the information we received your request is denied. We did not receive documentation that you meet the criteria outlined above. Formulary alternatives are: testosterone gel (except authorized generics for TESTIM and VOGELXO), testosterone solution, NATESTO. (Requirement: 2 alternatives.) Note: Formulary alternatives may require a prior authorization. Your prescriber will be responsible for determining what alternative is appropriate for you. We reviewed all the supporting information sent to [place] and used your plan's guidelines to make our decision. If you'd like [place] to send you a free copy of the guidelines (requirements, criteria, or protocols) we used, call the number on your prescription ID card. For more information about your prescription benefit, please refer to the prescription benefit section in your benefit plan materials. We've let your doctor know about this denial, so they will be ready to work with you on your next steps or an alternative treatment plan.  Notified Pt via: Mychart

## 2022-09-19 ENCOUNTER — Ambulatory Visit: Payer: No Typology Code available for payment source | Admitting: Family Medicine

## 2022-09-19 ENCOUNTER — Other Ambulatory Visit: Payer: Self-pay | Admitting: Family Medicine

## 2022-09-19 ENCOUNTER — Encounter: Payer: Self-pay | Admitting: Family Medicine

## 2022-09-19 VITALS — BP 135/77 | HR 64 | Ht 71.0 in | Wt 275.0 lb

## 2022-09-19 DIAGNOSIS — I10 Essential (primary) hypertension: Secondary | ICD-10-CM

## 2022-09-19 DIAGNOSIS — G4719 Other hypersomnia: Secondary | ICD-10-CM

## 2022-09-19 DIAGNOSIS — Z23 Encounter for immunization: Secondary | ICD-10-CM | POA: Diagnosis not present

## 2022-09-19 DIAGNOSIS — F324 Major depressive disorder, single episode, in partial remission: Secondary | ICD-10-CM

## 2022-09-19 DIAGNOSIS — F411 Generalized anxiety disorder: Secondary | ICD-10-CM

## 2022-09-19 NOTE — Assessment & Plan Note (Signed)
Still with significant anxiety and depressive symptoms.  #2 for question #9 on PHQ however denies any current ideation or plan.  Recommend referral to psychiatry for med management.  We will continue current medications for now.

## 2022-09-19 NOTE — Assessment & Plan Note (Signed)
We discussed how his alcohol intake may be worsening his depressive symptoms.  Recommend cutting back on this.

## 2022-09-19 NOTE — Progress Notes (Signed)
Hector Chavez - 51 y.o. male MRN [id number]  Date of birth: 02-Aug-1971  Subjective Chief Complaint  Patient presents with   Hypertension    HPI Hector Chavez is a 51 y.o. male here today for follow up visit.  He reports that he continues to have stress related to work.  Continues on medication of Lexapro and bupropion.  He does feel that these are helpful but perhaps regimen needs to be adjusted.  He was referred for therapy and scheduled appointment however did not go through with the visit.  He would be open to a psychiatry referral.  Blood pressure remains well controlled with combination of valsartan with hydrochlorothiazide and amlodipine.  Reports he is doing well at this time with these medications.  He denies chest pain, shortness of breath, palpitations, headaches or vision changes.   He has not had any further TIA symptoms.  Continues on clopidogrel and aspirin.  Additionally he remains on atorvastatin.  Followed by neurology.  ROS:  A comprehensive ROS was completed and negative except as noted per HPI    Allergies  Allergen Reactions   Meloxicam Swelling    Caused feet/ankles to swell    Past Medical History:  Diagnosis Date   Allergy    Depression    Heart murmur    Hyperlipidemia    Hypertension     Past Surgical History:  Procedure Laterality Date   LT knee lateral release  1981   RT knee lateral release  1994    Social History   Socioeconomic History   Marital status: Married    Spouse name: Not on file   Number of children: 1   Years of education: some college   Highest education level: Not on file  Occupational History   Not on file  Tobacco Use   Smoking status: Former    Types: Cigarettes    Quit date: 12/24/2003    Years since quitting: 18.7   Smokeless tobacco: Never  Vaping Use   Vaping Use: Never used  Substance and Sexual Activity   Alcohol use: Not Currently    Comment: 01/21/22 -- one bottle of wine daily - cutting down to two  glasses per day   Drug use: No   Sexual activity: Not on file  Other Topics Concern   Not on file  Social History Narrative   Production manager for [employer], married, 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   Right-handed.   No daily caffeine use.   Social Determinants of Health   Financial Resource Strain: Not on file  Food Insecurity: Not on file  Transportation Needs: Not on file  Physical Activity: Not on file  Stress: Not on file  Social Connections: Not on file    Family History  Problem Relation Age of Onset   Dementia Mother 45       (alzheimers)   Heart attack Father 66   Hypertension Father    Brain cancer Father    Cancer Other        lung/aunt/breast/cousins/mom and dads side   Depression Other        nephew    Health Maintenance  Topic Date Due   COVID-19 Vaccine (3 - Pfizer series) 01/23/2023 (Originally 02/04/2021)   Zoster Vaccines- Shingrix (1 of 2) 01/23/2023 (Originally 04/19/2019)   TETANUS/TDAP  02/07/2023 (Originally 04/18/1988)   Hepatitis C Screening  03/21/2023 (Originally 04/19/1987)   HIV Screening  03/21/2023 (Originally 04/18/1984)   Fecal DNA (Cologuard)  04/25/2023  INFLUENZA VACCINE  Completed   HPV VACCINES  Aged Out     ----------------------------------------------------------------------------------------------------------------------------------------------------------------------------------------------------------------- Physical Exam BP 135/77 (BP Location: Left Arm, Patient Position: Sitting, Cuff Size: Large)   Pulse 64   Ht 5\' 11"  (1.803 m)   Wt 275 lb (124.7 kg)   SpO2 98%   BMI 38.35 kg/m   Physical Exam Constitutional:      Appearance: Normal appearance.  Eyes:     General: No scleral icterus. Cardiovascular:     Rate and Rhythm: Normal rate and regular rhythm.  Pulmonary:     Effort: Pulmonary effort is normal.     Breath sounds: Normal breath sounds.  Musculoskeletal:     Cervical back: Neck supple.   Neurological:     Mental Status: He is alert.  Psychiatric:        Mood and Affect: Mood normal.        Behavior: Behavior normal.     ------------------------------------------------------------------------------------------------------------------------------------------------------------------------------------------------------------------- Assessment and Plan  Hypertension Blood pressure remains well controlled with current medications.  Recommend continuation.  GAD (generalized anxiety disorder) Still with significant anxiety and depressive symptoms.  #2 for question #9 on PHQ however denies any current ideation or plan.  Recommend referral to psychiatry for med management.  We will continue current medications for now.  MDD (major depressive disorder) We discussed how his alcohol intake may be worsening his depressive symptoms.  Recommend cutting back on this.     No orders of the defined types were placed in this encounter.   Return in about 4 months (around 01/19/2023) for HTN.    This visit occurred during the SARS-CoV-2 public health emergency.  Safety protocols were in place, including screening questions prior to the visit, additional usage of staff PPE, and extensive cleaning of exam room while observing appropriate contact time as indicated for disinfecting solutions.

## 2022-09-19 NOTE — Patient Instructions (Signed)
Alcohol Misuse Alcohol is a widely available drug and people choose to drink alcohol in different amounts. Alcohol misuse and dependence can have a negative effect on your life. Alcohol misuse is when you use alcohol too much or too often. You may have a hard time setting a limit on the amount you drink. Alcohol dependence is when you use alcohol consistently for a period of time, and your body changes as a result. Alcohol dependence can make it hard for you to stop drinking because you may start to feel sick or different when you do not drink alcohol. These symptoms are known as withdrawal. People who drink alcohol very often and in large amounts, may develop what is called an alcohol use disorder. How can alcohol misuse and dependence affect me? Drinking too much can lead to addiction. You may feel like you need alcohol to function normally. You may drink alcohol before work in the morning, during the day, or as soon as you get home from work in the evening. These actions can result in: Poor work performance. Job loss. Financial problems. Car crashes or criminal charges from driving after drinking alcohol. Problems in your relationships with friends and family. Losing the trust and respect of coworkers, friends, and family. Drinking heavily over a long period of time can permanently damage your body and brain, and can cause lifelong health issues, such as: Damage to your liver or pancreas. Heart problems, high blood pressure, or stroke. Certain cancers. Decreased ability to fight infections. Brain or nerve damage. Depression. Early death, also called premature death. If you are careless or you crave alcohol, it is easy to drink more than your body can handle (overdose). Alcohol overdose is a serious situation that requires hospitalization. It may lead to permanent injuries or death. What can increase my risk? Having a family history of alcohol misuse. Having depression or other mental health  conditions. Beginning to drink at an early age. Binge drinking often. Experiencing trauma, stress, and an unstable home life during childhood. Spending time with people who drink often. What actions can I take to prevent alcohol misuse and dependence? Do not drink alcohol if: Your health care provider tells you not to drink. You are pregnant, may be pregnant, or are planning to become pregnant. If you drink alcohol: Limit how much you have to: 0-1 drink a day for women who are not pregnant. 0-2 drinks a day for men. Know how much alcohol is in your drink. In the [place] one drink equals one 12 oz bottle of beer (355 mL), one 5 oz glass of wine (148 mL), or one 1 oz glass of hard liquor (44 mL). If you think you have an alcohol dependency problem, decide to stop drinking. This can be very hard to do if you are used to frequently drinking alcohol. If you begin to have withdrawal symptoms, talk with your health care provider or a person that you trust. These symptoms may include anxiety, shaky hands, headache, nausea, sweating, or not being able to sleep. Choose to drink nonalcoholic beverages in social gatherings and places where there may be alcohol. Activity Spend more time on activities that you enjoy that do not involve alcohol, like hobbies or exercise. Find healthy ways to cope with stress, such as meditation or spending time with people you care about. General information Talk to your family, coworkers, and friends about supporting you in your efforts to stop drinking. If they drink, ask them not to drink around you. Spend more  time with people who do not drink alcohol. If you think that you have an alcohol dependency problem: Tell friends or family about your concerns. Talk with your health care provider or another health professional about where to get help. Work with a Transport planner and a Regulatory affairs officer. Consider joining a support group for people who struggle with alcohol  misuse and dependence. Where to find support  Your health care provider. SMART Recovery: smartrecovery.org Local treatment centers or chemical dependency counselors. Local AA groups in your community: ShedSizes.ch Where to find more information Centers for Disease Control and Prevention: StoreMirror.com.cy [employer] on Alcohol Abuse and Alcoholism: LinkVoyage.dk Alcoholics Anonymous (AA): ShedSizes.ch Contact a health care provider if: You drank more or for longer than you intended on more than one occasion. You often drink to the point of vomiting or passing out. You have problems in your life due to drinking, but you continue to drink. You keep drinking even though you feel anxious, depressed, or have experienced memory loss. You have stopped doing the things you used to enjoy in order to drink. You have to drink more than you used to in order to get the effect you want. You experience anxiety, sweating, nausea, shakiness, and trouble sleeping when you try to stop drinking. Get help right away if: You have serious withdrawal symptoms, including: Confusion. Racing heart. High blood pressure. Fever. These symptoms may be an emergency. Get help right away. Call 911. Do not wait to see if the symptoms will go away. Do not drive yourself to the hospital. Also, get help right away if: You have thoughts about hurting yourself or others. Take one of these steps if you feel like you may hurt yourself or others, or have thoughts about taking your own life: Call 911. Call the [place] at 838-458-0623 or 988. This is open 24 hours a day. Text the Crisis Text Line at 613-401-1833. Summary Alcohol misuse and dependence can have a negative effect on your life. Drinking too much or too often can lead to addiction. If you drink alcohol, limit how much you use. If you are having trouble keeping your drinking under control, find ways to change your behavior. Hobbies, calming activities,  exercise, or support groups can help. If you feel you need help with changing your drinking habits, talk with your health care provider, a good friend, or a therapist, or go to a support group. This information is not intended to replace advice given to you by your health care provider. Make sure you discuss any questions you have with your health care provider. Document Revised: 02/13/2022 Document Reviewed: 02/13/2022 Elsevier Patient Education  [place].

## 2022-09-19 NOTE — Assessment & Plan Note (Signed)
Blood pressure remains well controlled with current medications.  Recommend continuation. 

## 2022-09-25 ENCOUNTER — Telehealth (HOSPITAL_COMMUNITY): Payer: Self-pay | Admitting: *Deleted

## 2022-09-25 NOTE — Telephone Encounter (Signed)
Reaching out to patient to offer assistance regarding upcoming cardiac imaging study; pt verbalizes understanding of appt date/time, parking situation and where to check in, name and call back number provided for further questions should they arise  Gordy Clement RN [facility] [phone] office [phone] cell  Patient denies metal or claustrophobia.

## 2022-09-27 ENCOUNTER — Ambulatory Visit (HOSPITAL_COMMUNITY)
Admission: RE | Admit: 2022-09-27 | Discharge: 2022-09-27 | Disposition: A | Discharge status: Home / Self Care | Payer: No Typology Code available for payment source | Source: Ambulatory Visit | Attending: Family Medicine | Admitting: Family Medicine

## 2022-09-27 ENCOUNTER — Other Ambulatory Visit: Payer: Self-pay | Admitting: Family Medicine

## 2022-09-27 DIAGNOSIS — G459 Transient cerebral ischemic attack, unspecified: Secondary | ICD-10-CM | POA: Diagnosis present

## 2022-09-27 DIAGNOSIS — I280 Arteriovenous fistula of pulmonary vessels: Secondary | ICD-10-CM | POA: Diagnosis not present

## 2022-09-27 DIAGNOSIS — Q249 Congenital malformation of heart, unspecified: Secondary | ICD-10-CM | POA: Insufficient documentation

## 2022-09-27 DIAGNOSIS — IMO0002 Reserved for concepts with insufficient information to code with codable children: Secondary | ICD-10-CM

## 2022-09-27 MED ORDER — GADOBUTROL 1 MMOL/ML IV SOLN
10.0000 mL | Freq: Once | INTRAVENOUS | Status: AC | PRN
  Administered 2022-09-27: 10 mL via INTRAVENOUS

## 2022-12-18 ENCOUNTER — Other Ambulatory Visit: Payer: Self-pay | Admitting: Family Medicine

## 2023-01-05 ENCOUNTER — Other Ambulatory Visit: Payer: Self-pay | Admitting: Family Medicine

## 2023-01-16 ENCOUNTER — Ambulatory Visit: Payer: No Typology Code available for payment source | Admitting: Family Medicine

## 2023-01-23 ENCOUNTER — Encounter: Payer: Self-pay | Admitting: Family Medicine

## 2023-01-23 ENCOUNTER — Ambulatory Visit: Payer: No Typology Code available for payment source | Admitting: Family Medicine

## 2023-01-23 VITALS — BP 123/78 | HR 70 | Ht 71.0 in | Wt 284.0 lb

## 2023-01-23 DIAGNOSIS — F411 Generalized anxiety disorder: Secondary | ICD-10-CM | POA: Diagnosis not present

## 2023-01-23 DIAGNOSIS — I10 Essential (primary) hypertension: Secondary | ICD-10-CM

## 2023-01-23 DIAGNOSIS — H539 Unspecified visual disturbance: Secondary | ICD-10-CM | POA: Diagnosis not present

## 2023-01-23 DIAGNOSIS — E291 Testicular hypofunction: Secondary | ICD-10-CM

## 2023-01-23 DIAGNOSIS — Z23 Encounter for immunization: Secondary | ICD-10-CM | POA: Diagnosis not present

## 2023-01-23 MED ORDER — TADALAFIL 5 MG PO TABS: 5.0000 mg | ORAL_TABLET | Freq: Every day | ORAL | 11 refills | Status: DC

## 2023-01-23 MED ORDER — TADALAFIL 5 MG PO TABS: 5.0000 mg | ORAL_TABLET | Freq: Every day | ORAL | 3 refills | Status: DC

## 2023-01-23 NOTE — Progress Notes (Signed)
Hector Chavez - 52 y.o. male MRN [id number]  Date of birth: Oct 18, 1971  Subjective Chief Complaint  Patient presents with   Hypertension   Wrist Pain    HPI Hector Chavez is a 52 year old male here today for follow-up visit.  He reports he is doing pretty good at this time.  He has discontinued Lexapro and bupropion as of a couple months ago.  Reports that his anxiety seems to be much better at this time.  He has stopped worrying about things that he cannot control.  He has not had any further TIA type episodes.  Per last neurology note was felt that these were complicated migraines.  Antiplatelet therapy was discontinued.  He has continued to cut back on his alcohol use.  Continues on amlodipine and valsartan/hydrochlorothiazide for management of hypertension.  Blood pressure has been better controlled recently.  He denies side effects of medication.  He has not had chest pain, shortness of breath, palpitations, headaches or vision changes.  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  Started on tadalafil which cleared this up.  He is requesting a renewal of this.  ROS:  A comprehensive ROS was completed and negative except as noted per HPI  Allergies  Allergen Reactions   Meloxicam Swelling    Caused feet/ankles to swell    Past Medical History:  Diagnosis Date   Allergy    Depression    Heart murmur    Hyperlipidemia    Hypertension     Past Surgical History:  Procedure Laterality Date   LT knee lateral release  1981   RT knee lateral release  1994    Social History   Socioeconomic History   Marital status: Married    Spouse name: Not on file   Number of children: 1   Years of education: some college   Highest education level: Not on file  Occupational History   Not on file  Tobacco Use   Smoking status: Former    Types: Cigarettes    Quit date: 12/24/2003    Years since quitting: 19.0   Smokeless tobacco: Never  Vaping Use   Vaping Use: Never used  Substance  and Sexual Activity   Alcohol use: Not Currently    Comment: 01/21/22 -- one bottle of wine daily - cutting down to two glasses per day   Drug use: No   Sexual activity: Not on file  Other Topics Concern   Not on file  Social History Narrative   Banker for [employer], married, 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   Right-handed.   No daily caffeine use.   Social Determinants of Health   Financial Resource Strain: Not on file  Food Insecurity: Not on file  Transportation Needs: Not on file  Physical Activity: Not on file  Stress: Not on file  Social Connections: Not on file    Family History  Problem Relation Age of Onset   Dementia Mother 56       (alzheimers)   Heart attack Father 54   Hypertension Father    Brain cancer Father    Cancer Other        lung/aunt/breast/cousins/mom and dads side   Depression Other        nephew    Health Maintenance  Topic Date Due   COVID-19 Vaccine (4 - 2023-24 season) 01/23/2023 (Originally 08/23/2022)   Zoster Vaccines- Shingrix (1 of 2) 01/23/2023 (Originally 04/19/2019)   Hepatitis C  Screening  03/21/2023 (Originally 04/19/1987)   HIV Screening  03/21/2023 (Originally 04/18/1984)   Fecal DNA (Cologuard)  04/25/2023   DTaP/Tdap/Td (2 - Td or Tdap) 01/23/2033   INFLUENZA VACCINE  Completed   HPV VACCINES  Aged Out     ----------------------------------------------------------------------------------------------------------------------------------------------------------------------------------------------------------------- Physical Exam BP 123/78 (BP Location: Left Arm, Patient Position: Sitting, Cuff Size: Large)   Pulse 70   Ht 5\' 11"  (1.803 m)   Wt 284 lb (128.8 kg)   SpO2 99%   BMI 39.61 kg/m   Physical Exam Constitutional:      Appearance: Normal appearance.  HENT:     Head: Normocephalic and atraumatic.  Eyes:     General: No scleral icterus. Cardiovascular:     Rate and Rhythm: Normal rate and regular  rhythm.  Pulmonary:     Effort: Pulmonary effort is normal.     Breath sounds: Normal breath sounds.  Neurological:     Mental Status: He is alert.  Psychiatric:        Mood and Affect: Mood normal.        Behavior: Behavior normal.     ------------------------------------------------------------------------------------------------------------------------------------------------------------------------------------------------------------------- Assessment and Plan  Hypertension Blood pressure is well-controlled at this time.  Recommend continuation of current medications for management of hypertension.  Transient vision disturbance, left Neurology felt this is likely more of a complicated migraine rather than TIA.  Remains off antiplatelet therapy at this time.  GAD (generalized anxiety disorder) Anxiety improved.  He has discontinued medications for anxiety.  He will let me know if he decides to restart these.   Meds ordered this encounter  Medications   DISCONTD: tadalafil (CIALIS) 5 MG tablet    Sig: Take 1 tablet (5 mg total) by mouth daily.    Dispense:  30 tablet    Refill:  11   tadalafil (CIALIS) 5 MG tablet    Sig: Take 1 tablet (5 mg total) by mouth daily.    Dispense:  90 tablet    Refill:  3    Return in about 6 months (around 07/24/2023) for Annual exam/fasting labs.    This visit occurred during the SARS-CoV-2 public health emergency.  Safety protocols were in place, including screening questions prior to the visit, additional usage of staff PPE, and extensive cleaning of exam room while observing appropriate contact time as indicated for disinfecting solutions.

## 2023-01-23 NOTE — Assessment & Plan Note (Signed)
Neurology felt this is likely more of a complicated migraine rather than TIA.  Remains off antiplatelet therapy at this time.

## 2023-01-23 NOTE — Assessment & Plan Note (Signed)
Blood pressure is well-controlled at this time.  Recommend continuation of current medications for management of hypertension.   

## 2023-01-23 NOTE — Assessment & Plan Note (Signed)
Anxiety improved.  He has discontinued medications for anxiety.  He will let me know if he decides to restart these.

## 2023-01-27 ENCOUNTER — Ambulatory Visit: Payer: No Typology Code available for payment source | Admitting: Sports Medicine

## 2023-02-03 ENCOUNTER — Ambulatory Visit: Payer: No Typology Code available for payment source | Admitting: Sports Medicine

## 2023-02-04 ENCOUNTER — Ambulatory Visit: Payer: No Typology Code available for payment source | Admitting: Sports Medicine

## 2023-02-04 ENCOUNTER — Ambulatory Visit (INDEPENDENT_AMBULATORY_CARE_PROVIDER_SITE_OTHER): Payer: No Typology Code available for payment source

## 2023-02-04 DIAGNOSIS — M654 Radial styloid tenosynovitis [de Quervain]: Secondary | ICD-10-CM

## 2023-02-04 MED ORDER — TRIAMCINOLONE ACETONIDE 40 MG/ML IJ SUSP
40.0000 mg | Freq: Once | INTRAMUSCULAR | Status: AC
  Administered 2023-02-04: 40 mg via INTRAMUSCULAR

## 2023-02-04 NOTE — Assessment & Plan Note (Signed)
This is a very pleasant 52 year old male, he has a history of de Quervain's tendinitis, we last treated him for this in September 2022 with a first extensor compartment injection, it worked well until recently, recurrence of pain, repeat injection per patient request. Home conditioning given, return to see me 6 weeks as needed.

## 2023-02-04 NOTE — Progress Notes (Signed)
    Procedures performed today:    Procedure: Real-time Ultrasound Guided injection of the left first extensor compartment Device: Samsung HS60  Verbal informed consent obtained.  Time-out conducted.  Noted no overlying erythema, induration, or other signs of local infection.  Skin prepped in a sterile fashion.  Local anesthesia: Topical Ethyl chloride.  With sterile technique and under real time ultrasound guidance: Noted minimal synovitis, 1 cc Kenalog 40, 1 cc lidocaine, 1 cc bupivacaine injected easily Completed without difficulty  Advised to call if fevers/chills, erythema, induration, drainage, or persistent bleeding.  Images permanently stored and available for review in PACS.  Impression: Technically successful ultrasound guided injection.  Independent interpretation of notes and tests performed by another provider:   None.  Brief History, Exam, Impression, and Recommendations:    De Quervain's tenosynovitis, left This is a very pleasant 52 year old male, he has a history of de Quervain's tendinitis, we last treated him for this in September 2022 with a first extensor compartment injection, it worked well until recently, recurrence of pain, repeat injection per patient request. Home conditioning given, return to see me 6 weeks as needed.    ____________________________________________ Gwen Her. Dianah Field, M.D., ABFM., CAQSM., AME. Primary Care and Sports Medicine [facility]  Adjunct Professor of [place] of [facility] of [employer]  Risk manager

## 2023-03-06 ENCOUNTER — Ambulatory Visit: Payer: No Typology Code available for payment source | Admitting: Sports Medicine

## 2023-03-18 ENCOUNTER — Ambulatory Visit: Payer: No Typology Code available for payment source | Admitting: Sports Medicine

## 2023-03-18 DIAGNOSIS — M654 Radial styloid tenosynovitis [de Quervain]: Secondary | ICD-10-CM | POA: Diagnosis not present

## 2023-03-18 NOTE — Assessment & Plan Note (Signed)
Pleasant 52 year old male, we diagnosed him with de Quervain's tendinitis at the last visit, he had a first extensor compartment injection February 04, 2023, he had fantastic initial relief, he still has a bit of discomfort with terminal ulnar deviation/a minimally positive Finkelstein sign. I would like him to do some aggressive formal physical therapy for 4 weeks before we consider MRI.

## 2023-03-18 NOTE — Progress Notes (Signed)
    Procedures performed today:    None.  Independent interpretation of notes and tests performed by another provider:   None.  Brief History, Exam, Impression, and Recommendations:    De Quervain's tenosynovitis, left Pleasant 52 year old male, we diagnosed him with de Quervain's tendinitis at the last visit, he had a first extensor compartment injection February 04, 2023, he had fantastic initial relief, he still has a bit of discomfort with terminal ulnar deviation/a minimally positive Finkelstein sign. I would like him to do some aggressive formal physical therapy for 4 weeks before we consider MRI.    ____________________________________________ Gwen Her. Dianah Field, M.D., ABFM., CAQSM., AME. Primary Care and Sports Medicine [place] [facility]  Adjunct Professor of [place] of [facility] of Medicine  Risk manager

## 2023-03-26 ENCOUNTER — Encounter: Payer: Self-pay | Admitting: Rehabilitative and Restorative Service Providers"

## 2023-03-26 ENCOUNTER — Ambulatory Visit
Payer: No Typology Code available for payment source | Attending: Sports Medicine | Admitting: Rehabilitative and Restorative Service Providers"

## 2023-03-26 ENCOUNTER — Other Ambulatory Visit: Payer: Self-pay

## 2023-03-26 DIAGNOSIS — M25542 Pain in joints of left hand: Secondary | ICD-10-CM | POA: Diagnosis present

## 2023-03-26 DIAGNOSIS — M25532 Pain in left wrist: Secondary | ICD-10-CM

## 2023-03-26 DIAGNOSIS — M654 Radial styloid tenosynovitis [de Quervain]: Secondary | ICD-10-CM | POA: Diagnosis not present

## 2023-03-26 NOTE — Therapy (Signed)
OUTPATIENT PHYSICAL THERAPY SHOULDER EVALUATION  Patient Name: Hector Chavez MRN: [id number] DOB:[birth year], 52 y.o., male Today's Date: 03/26/2023  END OF SESSION:  PT End of Session - 03/26/23 1419     Visit Number 1    Number of Visits 16    Date for PT Re-Evaluation 05/25/23    Authorization Type aetna    PT Start Time 1020    PT Stop Time 1104    PT Time Calculation (min) 44 min    Activity Tolerance Patient tolerated treatment well    Behavior During Therapy WFL for tasks assessed/performed             Past Medical History:  Diagnosis Date   Allergy    Depression    Heart murmur    Hyperlipidemia    Hypertension    Past Surgical History:  Procedure Laterality Date   LT knee lateral release  1981   RT knee lateral release  1994   Patient Active Problem List   Diagnosis Date Noted   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 07/08/2022   Blurry vision 01/21/2022   Foreign body in head 01/07/2022   Transient vision disturbance, left 01/04/2022   Hypertension 01/04/2022   De Quervain's tenosynovitis, left 08/14/2021   Nodule of finger of right hand 05/17/2021   MDD (major depressive disorder) 05/17/2021   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 07/05/2020   Primary osteoarthritis of right hand 07/05/2020   Well adult exam 03/23/2020   GAD (generalized anxiety disorder) 02/03/2020   Severe obstructive sleep apnea 04/07/2017   Snoring 02/02/2017   Palpitations 02/02/2017   Excessive daytime sleepiness 02/02/2017   Hyperlipidemia 06/28/2011   ALLERGIC RHINITIS 07/27/2009   CARDIAC MURMUR 07/27/2009    PCP: Hector Nutting, DO  REFERRING PROVIDER: Aundria Mems, MD  REFERRING DIAG: M65.4 (ICD-10-CM) - De Quervain's tenosynovitis, left   THERAPY DIAG:  Pain in left wrist  Pain in joints of left hand  Rationale for Evaluation and Treatment: Rehabilitation  ONSET DATE: 2022  SUBJECTIVE:                                                                                                                                                                                       SUBJECTIVE STATEMENT: The patient has had pain in the L wrist since end of 2022. He has had 2 injections and continues to have pain. It initially flared with yard work. Pain is worse with ulnar deviation and extension. He wraps it to play golf, but it still gets sore. He plays golf (starting with 9 holes) regularly (2 trips/week to the driving range and playing 2x/month). Hand dominance: Right  PERTINENT HISTORY: HTN, cholesterol. Pleasant 52 year old male, we diagnosed him with de Quervain's tendinitis at the last visit, he had a first extensor compartment injection February 04, 2023, he had fantastic initial relief, he still has a bit of discomfort with terminal ulnar deviation/a minimally positive Finkelstein sign.  PAIN:  Are you having pain? Yes: NPRS scale: None at rest/10 Pain location: radial side of wrist Pain description: sharp pain, shooting Aggravating factors: golf, reaching Relieving factors: rest  Pain shoots up to 7/10 when reaching "wrong" PRECAUTIONS: None WEIGHT BEARING RESTRICTIONS: No FALLS:  Has patient fallen in last 6 months? No LIVING ENVIRONMENT: Lives with: lives with their spouse Lives in: House/apartment OCCUPATION: Works from home -- wrist not painful with typing/computer use PLOF: Independent PATIENT GOALS:Reduce pain with activity  OBJECTIVE:   PATIENT SURVEYS:  FOTO 83%  -- patient not limited in most daily tasks   SENSATION: WFL  POSTURE: Tightness in bilateral pectoralis musculature noted with rounded shoulder positioning  UPPER EXTREMITY ROM:   Active ROM Right eval Left eval  Shoulder flexion    Shoulder extension    Shoulder abduction    Shoulder adduction    Shoulder internal rotation    Shoulder external rotation    Elbow flexion    Elbow extension    Wrist flexion 80deg 65deg Pain 5/10  Wrist  extension 70deg 60deg No pain with forearm supported; pain when not supported  Wrist ulnar deviation  45 deg 1/10 pain  Wrist radial deviation  35deg  Wrist pronation  WNLs  Wrist supination  WNLs  (Blank rows = not tested)  UPPER EXTREMITY MMT:  MMT Right eval Left eval  Wrist flexion    Wrist extension    Wrist ulnar deviation    Wrist radial deviation    Wrist pronation    Wrist supination    Grip strength (lbs) 130 lb 110 lb  Pincer strength 19 lb 19 lb  *Not tested due to pain with AROM that is sharp/shooting, will assess strength further  SPECIAL TESTS: Phalen's + for pain in L wrist Finkelstein's + for pain L wrist  JOINT MOBILITY TESTING:  Mobilization of carpals and metacarpals WNLs  PALPATION:  Tightness through forearm musculature/extensor bundle and supinators   [facility] Adult PT Treatment:                                                DATE: 03/26/23 Therapeutic Exercise: A/ROM into flexion with overpressure is provoking for significant pain See HEP below Nerve glides  PATIENT EDUCATION: Education details: HEP Person educated: Patient Education method: Consulting civil engineer, Media planner, and Handouts Education comprehension: verbalized understanding, returned demonstration, and needs further education  HOME EXERCISE PROGRAM: Access Code: [email or web address] URL: [email or web address] Date: 03/26/2023 Prepared by: Hector Chavez  Exercises - Standing Radial Nerve Glide  - 2 x daily - 7 x weekly - 1 sets - 10 reps - Standing Radial Nerve Glide  - 2 x daily - 7 x weekly - 1 sets - 10 reps - Doorway Pec Stretch at 60 Elevation  - 2 x daily - 7 x weekly - 1 sets - 2 reps - 30 seconds hold - Seated Forearm Pronation and Supination AROM  - 2 x daily - 7 x weekly - 1 sets - 10 reps  ASSESSMENT:  CLINICAL IMPRESSION: Patient is a 52 y.o. male who was seen today  for physical therapy evaluation and treatment for chronic left wrist pain. He presents today with  mild impairments in ROM, pain with passive overpressure into wrist flexion, pain with unsupported wrist AROM extension, pain with finklestein's and phalen's in radial aspect of wrist, muscle tightness, and pain during reaching tasks. PT plans to address deficits in order to reduce pain    OBJECTIVE IMPAIRMENTS: decreased ROM, decreased strength, increased fascial restrictions, impaired flexibility, and pain.   ACTIVITY LIMITATIONS:  reaching  PARTICIPATION LIMITATIONS:  occasional daily tasks  PERSONAL FACTORS: 1 comorbidity: HTN  are also affecting patient's functional outcome.   REHAB POTENTIAL: Good  CLINICAL DECISION MAKING: Stable/uncomplicated  EVALUATION COMPLEXITY: Low  GOALS: Goals reviewed with patient? Yes  SHORT TERM GOALS: Target date: 04/23/23  The patient will be indep with HEP.  Baseline: initiated at eval Goal status: INITIAL  2.  The patient will tolerate passive overpressure into wrist flexion stretch without pain. Baseline:  Pain with wrist flexion Goal status: INITIAL  3.  The patient will tolerate A/ROM wrist extension without pain. Baseline:  sharp pain AROM wrist extension Goal status: INITIAL  LONG TERM GOALS: Target date: 05/21/23  The patient will progress HEP for post d/c program. Baseline:  Initiated at eval Goal status: INITIAL  2.  The patient will report no incidence of shooting wrist pain with reaching. Baseline:  Notes reaching can lead to shooting R wrist pain. Goal status: INITIAL  3.  The patient will be able to tolerate wall lean with bilat Ues with equal ROM for wrist extension. Baseline:  Mild decrease R to L wrist extension Goal status: INITIAL  4.  The patient will be further tested for strength and R wrist will = L wrist for MMT. Baseline: Not tested due to pain. Goal status: INITIAL  PLAN:  PT FREQUENCY: 1-2x/week  PT DURATION: 8 weeks  PLANNED INTERVENTIONS: Therapeutic exercises, Therapeutic activity, Neuromuscular  re-education, Balance training, Gait training, Patient/Family education, Self Care, Joint mobilization, Dry Needling, Cryotherapy, Moist heat, Taping, Ultrasound, Ionotophoresis 4mg /ml Dexamethasone, and Manual therapy  PLAN FOR NEXT SESSION: do MMT if tolerated, dry needling wrist extensors, eccentric strengthening, ROM to tolerance and STM.   [place], PT 03/26/2023, 2:20 PM

## 2023-04-02 ENCOUNTER — Encounter: Payer: Self-pay | Admitting: Physical Therapy

## 2023-04-02 ENCOUNTER — Ambulatory Visit: Payer: No Typology Code available for payment source | Admitting: Physical Therapy

## 2023-04-02 DIAGNOSIS — M25532 Pain in left wrist: Secondary | ICD-10-CM | POA: Diagnosis not present

## 2023-04-02 DIAGNOSIS — M25542 Pain in joints of left hand: Secondary | ICD-10-CM

## 2023-04-02 NOTE — Therapy (Signed)
OUTPATIENT PHYSICAL THERAPY SHOULDER EVALUATION  Patient Name: Hector Chavez MRN: [id number] DOB:09-Mar-1971, 52 y.o., male Today's Date: 04/02/2023  END OF SESSION:  PT End of Session - 04/02/23 1610     Visit Number 2    Number of Visits 16    Date for PT Re-Evaluation 05/25/23    PT Start Time 1530    PT Stop Time 1609    PT Time Calculation (min) 39 min    Activity Tolerance Patient tolerated treatment well    Behavior During Therapy [facility] for tasks assessed/performed              Past Medical History:  Diagnosis Date   Allergy    Depression    Heart murmur    Hyperlipidemia    Hypertension    Past Surgical History:  Procedure Laterality Date   LT knee lateral release  1981   RT knee lateral release  1994   Patient Active Problem List   Diagnosis Date Noted   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 07/08/2022   Blurry vision 01/21/2022   Foreign body in head 01/07/2022   Transient vision disturbance, left 01/04/2022   Hypertension 01/04/2022   De Quervain's tenosynovitis, left 08/14/2021   Nodule of finger of right hand 05/17/2021   MDD (major depressive disorder) 05/17/2021   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 07/05/2020   Primary osteoarthritis of right hand 07/05/2020   Well adult exam 03/23/2020   GAD (generalized anxiety disorder) 02/03/2020   Severe obstructive sleep apnea 04/07/2017   Snoring 02/02/2017   Palpitations 02/02/2017   Excessive daytime sleepiness 02/02/2017   Hyperlipidemia 06/28/2011   ALLERGIC RHINITIS 07/27/2009   CARDIAC MURMUR 07/27/2009    PCP: Everrett Coombeody Matthews, DO  REFERRING PROVIDER: Rodney Langtonhomas Thekkekandam, MD  REFERRING DIAG: M65.4 (ICD-10-CM) - De Quervain's tenosynovitis, left   THERAPY DIAG:  Pain in left wrist  Pain in joints of left hand  Rationale for Evaluation and Treatment: Rehabilitation  ONSET DATE: 2022  SUBJECTIVE:                                                                                                                                                                                       SUBJECTIVE STATEMENT: Pt states he still feels "sore" when stretching. No pain currently Hand dominance: Right PERTINENT HISTORY: HTN, cholesterol. Pleasant 52 year old male, we diagnosed him with de Quervain's tendinitis at the last visit, he had a first extensor compartment injection February 04, 2023, he had fantastic initial relief, he still has a bit of discomfort with terminal ulnar deviation/a minimally positive Finkelstein sign.  PAIN:  Are you having pain? Yes: NPRS scale: None  at rest/10 Pain location: radial side of wrist Pain description: sharp pain, shooting Aggravating factors: golf, reaching Relieving factors: rest  Pain shoots up to 7/10 when reaching "wrong" PRECAUTIONS: None WEIGHT BEARING RESTRICTIONS: No FALLS:  Has patient fallen in last 6 months? No LIVING ENVIRONMENT: Lives with: lives with their spouse Lives in: House/apartment OCCUPATION: Works from home -- wrist not painful with typing/computer use PLOF: Independent PATIENT GOALS:Reduce pain with activity  OBJECTIVE:   PATIENT SURVEYS:  FOTO 83%  -- patient not limited in most daily tasks   UPPER EXTREMITY ROM:   Active ROM Right eval Left eval  Shoulder flexion    Shoulder extension    Shoulder abduction    Shoulder adduction    Shoulder internal rotation    Shoulder external rotation    Elbow flexion    Elbow extension    Wrist flexion 80deg 65deg Pain 5/10  Wrist extension 70deg 60deg No pain with forearm supported; pain when not supported  Wrist ulnar deviation  45 deg 1/10 pain  Wrist radial deviation  35deg  Wrist pronation  WNLs  Wrist supination  WNLs  (Blank rows = not tested)  UPPER EXTREMITY MMT:  MMT Right eval Left eval  Wrist flexion    Wrist extension    Wrist ulnar deviation    Wrist radial deviation    Wrist pronation    Wrist supination    Grip strength  (lbs) 130 lb 110 lb  Pincer strength 19 lb 19 lb  *Not tested due to pain with AROM that is sharp/shooting, will assess strength further  SPECIAL TESTS: Phalen's + for pain in L wrist Finkelstein's + for pain L wrist  JOINT MOBILITY TESTING:  Mobilization of carpals and metacarpals WNLs  PALPATION:  Tightness through forearm musculature/extensor bundle and supinators   [facility] Adult PT Treatment:                                                DATE: 04/02/23 Therapeutic Exercise: Wrist ext 2# x 15: up in 1 sec, down in 3 sec Pronate/supinate 2# x 15 radial deviation 2# x 15 Finger extension red finger web x 20 Red flexbar wrist flex/ext x 1 min Wrist flex stretch 3 x 20 sec Wrist ex stretch 3 x 20 sec AROM in finkelsteins stretch position x 1 min in tolerable range Manual Therapy: STM and TPR Lt wrist extensors and pronator   [facility] Adult PT Treatment:                                                DATE: 03/26/23 Therapeutic Exercise: A/ROM into flexion with overpressure is provoking for significant pain See HEP below Nerve glides  PATIENT EDUCATION: Education details: HEP Person educated: Patient Education method: Programmer, multimedia, Facilities manager, and Handouts Education comprehension: verbalized understanding, returned demonstration, and needs further education  HOME EXERCISE PROGRAM: Access Code: [email or web address] URL: [email or web address] Date: 04/02/2023 Prepared by: Reggy Eye  Exercises - Standing Radial Nerve Glide  - 2 x daily - 7 x weekly - 1 sets - 10 reps - Standing Radial Nerve Glide  - 2 x daily - 7 x weekly - 1 sets - 10 reps - Doorway Pec Stretch at 60 Elevation  -  2 x daily - 7 x weekly - 1 sets - 2 reps - 30 seconds hold - Forearm Pronation and Supination with Hammer  - 1 x daily - 7 x weekly - 3 sets - 10 reps - Seated Wrist Extension with Dumbbell  - 1 x daily - 7 x weekly - 3 sets - 10 reps - Seated Wrist Radial Deviation with Dumbbell  - 1 x daily  - 7 x weekly - 3 sets - 10 reps  ASSESSMENT:  CLINICAL IMPRESSION: Pt with good tolerance to addition of strengthening exercises. Educated pt on feeling stretch but not pain when stretching his wrist. HEP updated  OBJECTIVE IMPAIRMENTS: decreased ROM, decreased strength, increased fascial restrictions, impaired flexibility, and pain.   GOALS: Goals reviewed with patient? Yes  SHORT TERM GOALS: Target date: 04/23/23  The patient will be indep with HEP.  Baseline: initiated at eval Goal status: INITIAL  2.  The patient will tolerate passive overpressure into wrist flexion stretch without pain. Baseline:  Pain with wrist flexion Goal status: INITIAL  3.  The patient will tolerate A/ROM wrist extension without pain. Baseline:  sharp pain AROM wrist extension Goal status: INITIAL  LONG TERM GOALS: Target date: 05/21/23  The patient will progress HEP for post d/c program. Baseline:  Initiated at eval Goal status: INITIAL  2.  The patient will report no incidence of shooting wrist pain with reaching. Baseline:  Notes reaching can lead to shooting R wrist pain. Goal status: INITIAL  3.  The patient will be able to tolerate wall lean with bilat Ues with equal ROM for wrist extension. Baseline:  Mild decrease R to L wrist extension Goal status: INITIAL  4.  The patient will be further tested for strength and R wrist will = L wrist for MMT. Baseline: Not tested due to pain. Goal status: INITIAL  PLAN:  PT FREQUENCY: 1-2x/week  PT DURATION: 8 weeks  PLANNED INTERVENTIONS: Therapeutic exercises, Therapeutic activity, Neuromuscular re-education, Balance training, Gait training, Patient/Family education, Self Care, Joint mobilization, Dry Needling, Cryotherapy, Moist heat, Taping, Ultrasound, Ionotophoresis 4mg /ml Dexamethasone, and Manual therapy  PLAN FOR NEXT SESSION: do MMT if tolerated, dry needling wrist extensors, eccentric strengthening, ROM to tolerance and  STM.   Nikoleta Dady, PT 04/02/2023, 4:10 PM

## 2023-04-04 ENCOUNTER — Encounter: Payer: Self-pay | Admitting: Physical Therapy

## 2023-04-04 ENCOUNTER — Ambulatory Visit: Payer: No Typology Code available for payment source | Admitting: Physical Therapy

## 2023-04-04 DIAGNOSIS — M25532 Pain in left wrist: Secondary | ICD-10-CM

## 2023-04-04 DIAGNOSIS — M25542 Pain in joints of left hand: Secondary | ICD-10-CM

## 2023-04-04 NOTE — Therapy (Signed)
OUTPATIENT PHYSICAL THERAPY SHOULDER EVALUATION  Patient Name: Hector Chavez MRN: [id number] DOB:Mar 01, 1971, 52 y.o., male Today's Date: 04/04/2023  END OF SESSION:  PT End of Session - 04/04/23 1141     Visit Number 3    Number of Visits 16    Date for PT Re-Evaluation 05/25/23    Authorization Type aetna    PT Start Time 1100    PT Stop Time 1139    PT Time Calculation (min) 39 min    Activity Tolerance Patient tolerated treatment well    Behavior During Therapy WFL for tasks assessed/performed               Past Medical History:  Diagnosis Date   Allergy    Depression    Heart murmur    Hyperlipidemia    Hypertension    Past Surgical History:  Procedure Laterality Date   LT knee lateral release  1981   RT knee lateral release  1994   Patient Active Problem List   Diagnosis Date Noted   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 07/08/2022   Blurry vision 01/21/2022   Foreign body in head 01/07/2022   Transient vision disturbance, left 01/04/2022   Hypertension 01/04/2022   De Quervain's tenosynovitis, left 08/14/2021   Nodule of finger of right hand 05/17/2021   MDD (major depressive disorder) 05/17/2021   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 07/05/2020   Primary osteoarthritis of right hand 07/05/2020   Well adult exam 03/23/2020   GAD (generalized anxiety disorder) 02/03/2020   Severe obstructive sleep apnea 04/07/2017   Snoring 02/02/2017   Palpitations 02/02/2017   Excessive daytime sleepiness 02/02/2017   Hyperlipidemia 06/28/2011   ALLERGIC RHINITIS 07/27/2009   CARDIAC MURMUR 07/27/2009    PCP: Everrett Coombe, DO  REFERRING PROVIDER: Rodney Langton, MD  REFERRING DIAG: M65.4 (ICD-10-CM) - De Quervain's tenosynovitis, left   THERAPY DIAG:  Pain in left wrist  Pain in joints of left hand  Rationale for Evaluation and Treatment: Rehabilitation  ONSET DATE: 2022  SUBJECTIVE:                                                                                                                                                                                       SUBJECTIVE STATEMENT: Pt states he still feels "sore" when stretching. No pain currently Hand dominance: Right PERTINENT HISTORY: HTN, cholesterol. Pleasant 52 year old male, we diagnosed him with de Quervain's tendinitis at the last visit, he had a first extensor compartment injection February 04, 2023, he had fantastic initial relief, he still has a bit of discomfort with terminal ulnar deviation/a minimally positive Finkelstein sign.  PAIN:  Are  you having pain? Yes: NPRS scale: None at rest/10 Pain location: radial side of wrist Pain description: sharp pain, shooting Aggravating factors: golf, reaching Relieving factors: rest  Pain shoots up to 7/10 when reaching "wrong" PRECAUTIONS: None WEIGHT BEARING RESTRICTIONS: No FALLS:  Has patient fallen in last 6 months? No LIVING ENVIRONMENT: Lives with: lives with their spouse Lives in: House/apartment OCCUPATION: Works from home -- wrist not painful with typing/computer use PLOF: Independent PATIENT GOALS:Reduce pain with activity  OBJECTIVE:   PATIENT SURVEYS:  FOTO 83%  -- patient not limited in most daily tasks   UPPER EXTREMITY ROM:   Active ROM Right eval Left eval  Shoulder flexion    Shoulder extension    Shoulder abduction    Shoulder adduction    Shoulder internal rotation    Shoulder external rotation    Elbow flexion    Elbow extension    Wrist flexion 80deg 65deg Pain 5/10  Wrist extension 70deg 60deg No pain with forearm supported; pain when not supported  Wrist ulnar deviation  45 deg 1/10 pain  Wrist radial deviation  35deg  Wrist pronation  WNLs  Wrist supination  WNLs  (Blank rows = not tested)  UPPER EXTREMITY MMT:  MMT Right eval Left eval  Wrist flexion    Wrist extension    Wrist ulnar deviation    Wrist radial deviation    Wrist pronation     Wrist supination    Grip strength (lbs) 130 lb 110 lb  Pincer strength 19 lb 19 lb  *Not tested due to pain with AROM that is sharp/shooting, will assess strength further  SPECIAL TESTS: Phalen's + for pain in L wrist Finkelstein's + for pain L wrist  JOINT MOBILITY TESTING:  Mobilization of carpals and metacarpals WNLs  PALPATION:  Tightness through forearm musculature/extensor bundle and supinators  [facility] Adult PT Treatment:                                                DATE: 04/04/23 Therapeutic Exercise: Wrist ext 2# x 15: up in 1 sec, down in 3 sec Pronate/supinate 2# x 15 radial deviation 2# x 15, up in 1 sec, down in 3 sec Finger extension green finger web x 20 Red Flex bar wiggles in full pronation and thumb up each x 1 min Red Flex bar wrist flex/ext x 1 min Wrist flex stretch 3 x 20 sec Wrist ex stretch 3 x 20 sec Finkelstiens stretch 2 x30 sec Manual Therapy: STM and TPR Lt thumb and wrist musculature  Therapeutic Activity: Discussed use of brace full time at home except when sleeping    [facility] Adult PT Treatment:                                                DATE: 04/02/23 Therapeutic Exercise: Wrist ext 2# x 15: up in 1 sec, down in 3 sec Pronate/supinate 2# x 15 radial deviation 2# x 15 Finger extension red finger web x 20 Red flexbar wrist flex/ext x 1 min Wrist flex stretch 3 x 20 sec Wrist ex stretch 3 x 20 sec AROM in finkelsteins stretch position x 1 min in tolerable range Manual Therapy: STM and TPR Lt wrist  extensors and pronator   [facility] Adult PT Treatment:                                                DATE: 03/26/23 Therapeutic Exercise: A/ROM into flexion with overpressure is provoking for significant pain See HEP below Nerve glides  PATIENT EDUCATION: Education details: HEP Person educated: Patient Education method: Programmer, multimedia, Demonstration, and Handouts Education comprehension: verbalized understanding, returned demonstration, and needs  further education  HOME EXERCISE PROGRAM: Access Code: [email or web address] URL: [email or web address] Date: 04/02/2023 Prepared by: Reggy Eye  Exercises - Standing Radial Nerve Glide  - 2 x daily - 7 x weekly - 1 sets - 10 reps - Standing Radial Nerve Glide  - 2 x daily - 7 x weekly - 1 sets - 10 reps - Doorway Pec Stretch at 60 Elevation  - 2 x daily - 7 x weekly - 1 sets - 2 reps - 30 seconds hold - Forearm Pronation and Supination with Hammer  - 1 x daily - 7 x weekly - 3 sets - 10 reps - Seated Wrist Extension with Dumbbell  - 1 x daily - 7 x weekly - 3 sets - 10 reps - Seated Wrist Radial Deviation with Dumbbell  - 1 x daily - 7 x weekly - 3 sets - 10 reps  ASSESSMENT:  CLINICAL IMPRESSION: Pt continues with "catching" pain with end range wrist flexion and with finklesteins stretch. Discussed wearing his brace full time at home so tendon can "rest" while he is typing. Good tolerance to progression of strength and endurance exercises today  OBJECTIVE IMPAIRMENTS: decreased ROM, decreased strength, increased fascial restrictions, impaired flexibility, and pain.   GOALS: Goals reviewed with patient? Yes  SHORT TERM GOALS: Target date: 04/23/23  The patient will be indep with HEP.  Baseline: initiated at eval Goal status: INITIAL  2.  The patient will tolerate passive overpressure into wrist flexion stretch without pain. Baseline:  Pain with wrist flexion Goal status: INITIAL  3.  The patient will tolerate A/ROM wrist extension without pain. Baseline:  sharp pain AROM wrist extension Goal status: INITIAL  LONG TERM GOALS: Target date: 05/21/23  The patient will progress HEP for post d/c program. Baseline:  Initiated at eval Goal status: INITIAL  2.  The patient will report no incidence of shooting wrist pain with reaching. Baseline:  Notes reaching can lead to shooting R wrist pain. Goal status: INITIAL  3.  The patient will be able to tolerate wall lean with  bilat Ues with equal ROM for wrist extension. Baseline:  Mild decrease R to L wrist extension Goal status: INITIAL  4.  The patient will be further tested for strength and R wrist will = L wrist for MMT. Baseline: Not tested due to pain. Goal status: INITIAL  PLAN:  PT FREQUENCY: 1-2x/week  PT DURATION: 8 weeks  PLANNED INTERVENTIONS: Therapeutic exercises, Therapeutic activity, Neuromuscular re-education, Balance training, Gait training, Patient/Family education, Self Care, Joint mobilization, Dry Needling, Cryotherapy, Moist heat, Taping, Ultrasound, Ionotophoresis /ml Dexamethasone, and Manual therapy  PLAN FOR NEXT SESSION: how is wearing brace? do MMT if tolerated, dry needling wrist extensors, eccentric strengthening, ROM to tolerance and STM.   Faizah Kandler, PT 04/04/2023, 11:42 AM

## 2023-04-07 ENCOUNTER — Other Ambulatory Visit: Payer: Self-pay | Admitting: Family Medicine

## 2023-04-09 ENCOUNTER — Ambulatory Visit: Payer: No Typology Code available for payment source

## 2023-04-09 DIAGNOSIS — M25532 Pain in left wrist: Secondary | ICD-10-CM | POA: Diagnosis not present

## 2023-04-09 DIAGNOSIS — M25542 Pain in joints of left hand: Secondary | ICD-10-CM

## 2023-04-09 NOTE — Therapy (Addendum)
 OUTPATIENT PHYSICAL THERAPY SHOULDER EVALUATION AND DISCHARGE  Patient Name: Hector Chavez MRN: [id number] DOB:01-06-1971, 52 y.o., male Today's Date: 04/09/2023  END OF SESSION:  PT End of Session - 04/09/23 1541     Visit Number 4    Number of Visits 16    Date for PT Re-Evaluation 05/25/23    Authorization Type aetna    PT Start Time 1540    PT Stop Time 1623    PT Time Calculation (min) 43 min    Activity Tolerance Patient tolerated treatment well    Behavior During Therapy WFL for tasks assessed/performed               Past Medical History:  Diagnosis Date   Allergy    Depression    Heart murmur    Hyperlipidemia    Hypertension    Past Surgical History:  Procedure Laterality Date   LT knee lateral release  1981   RT knee lateral release  1994   Patient Active Problem List   Diagnosis Date Noted   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 07/08/2022   Blurry vision 01/21/2022   Foreign body in head 01/07/2022   Transient vision disturbance, left 01/04/2022   Hypertension 01/04/2022   De Quervain's tenosynovitis, left 08/14/2021   Nodule of finger of right hand 05/17/2021   MDD (major depressive disorder) 05/17/2021   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 07/05/2020   Primary osteoarthritis of right hand 07/05/2020   Well adult exam 03/23/2020   GAD (generalized anxiety disorder) 02/03/2020   Severe obstructive sleep apnea 04/07/2017   Snoring 02/02/2017   Palpitations 02/02/2017   Excessive daytime sleepiness 02/02/2017   Hyperlipidemia 06/28/2011   ALLERGIC RHINITIS 07/27/2009   CARDIAC MURMUR 07/27/2009    PCP: Everrett Coombe, DO  REFERRING PROVIDER: Rodney Langton, MD  REFERRING DIAG: M65.4 (ICD-10-CM) - De Quervain's tenosynovitis, left   THERAPY DIAG:  Pain in left wrist  Pain in joints of left hand  Rationale for Evaluation and Treatment: Rehabilitation  ONSET DATE: 2022  SUBJECTIVE:                                                                                                                                                                                       SUBJECTIVE STATEMENT: Patient reports he has had new brace for few days and it is working well. Patient states he was able to play golf with no issues, states he has pain with wrist flexion/extension.  Hand dominance: Right  PERTINENT HISTORY: HTN, cholesterol. Pleasant 52 year old male, we diagnosed him with de Quervain's tendinitis at the last visit, he had a first extensor compartment injection February 04, 2023, he had fantastic initial relief, he still has a bit of discomfort with terminal ulnar deviation/a minimally positive Finkelstein sign.  PAIN:  Are you having pain? Yes: NPRS scale: None at rest/10 Pain location: radial side of wrist Pain description: sharp pain, shooting Aggravating factors: golf, reaching Relieving factors: rest  Pain shoots up to 7/10 when reaching "wrong" PRECAUTIONS: None WEIGHT BEARING RESTRICTIONS: No FALLS:  Has patient fallen in last 6 months? No LIVING ENVIRONMENT: Lives with: lives with their spouse Lives in: House/apartment OCCUPATION: Works from home -- wrist not painful with typing/computer use PLOF: Independent PATIENT GOALS:Reduce pain with activity  OBJECTIVE:   PATIENT SURVEYS:  FOTO 83%  -- patient not limited in most daily tasks   UPPER EXTREMITY ROM:   Active ROM Right eval Left eval  Shoulder flexion    Shoulder extension    Shoulder abduction    Shoulder adduction    Shoulder internal rotation    Shoulder external rotation    Elbow flexion    Elbow extension    Wrist flexion 80deg 65deg Pain 5/10  Wrist extension 70deg 60deg No pain with forearm supported; pain when not supported  Wrist ulnar deviation  45 deg 1/10 pain  Wrist radial deviation  35deg  Wrist pronation  WNLs  Wrist supination  WNLs  (Blank rows = not tested)  UPPER EXTREMITY MMT:  MMT  Right eval Left eval  Wrist flexion    Wrist extension    Wrist ulnar deviation    Wrist radial deviation    Wrist pronation    Wrist supination    Grip strength (lbs) 130 lb 110 lb  Pincer strength 19 lb 19 lb  *Not tested due to pain with AROM that is sharp/shooting, will assess strength further  SPECIAL TESTS: Phalen's + for pain in L wrist Finkelstein's + for pain L wrist  JOINT MOBILITY TESTING:  Mobilization of carpals and metacarpals WNLs  PALPATION:  Tightness through forearm musculature/extensor bundle and supinators  [facility] Adult PT Treatment:                                                DATE: 04/09/2023 Therapeutic Exercise: Wrist ext 3#DB x 15: up in 1 sec, down in 3 sec Pronate/supinate 3#DB x 15  2#DB hammer twists x15 Ulnar/radial deviations GTB --> 3# x 15, up in 1 sec, down in 3 sec Finger extension green finger web x 20 Red therabar twists --> rainbows Red Flex bar wiggles in full pronation and thumb up each x 1 min Wrist flex stretch 3 x 20 sec Wrist ex stretch 3 x 20 sec Velcro board: paddle stick Elastic from handmaster: resisted thumb extension/abduction --> hand squeeze/digit extension --> hand squeeze/thumb extension Thumb flexion isometric 5x5" Finkelstiens stretch 2 x30 sec Manual Therapy: STM and TPR Lt thumb and wrist musculature    [facility] Adult PT Treatment:                                                DATE: 04/04/23 Therapeutic Exercise: Wrist ext 2# x 15: up in 1 sec, down in 3 sec Pronate/supinate 2# x 15 radial deviation 2# x 15, up in 1 sec, down in 3 sec  Finger extension green x 20 Red Flex bar wiggles in full pronation and thumb up each x 1 min Red Flex bar wrist flex/ext x 1 min Wrist flex stretch 3 x 20 sec Wrist ex stretch 3 x 20 sec Finkelstiens stretch 2 x30 sec Manual Therapy: STM and TPR Lt thumb and wrist musculature  Therapeutic Activity: Discussed use of brace full time at home except when sleeping    PATIENT  EDUCATION: Education details: HEP Person educated: Patient Education method: Programmer, multimedia, Facilities manager, and Handouts Education comprehension: verbalized understanding, returned demonstration, and needs further education  HOME EXERCISE PROGRAM: Access Code: [email or web address] URL: [email or web address] Date: 04/02/2023 Prepared by: Reggy Eye  Exercises - Standing Radial Nerve Glide  - 2 x daily - 7 x weekly - 1 sets - 10 reps - Standing Radial Nerve Glide  - 2 x daily - 7 x weekly - 1 sets - 10 reps - Doorway Pec Stretch at 60 Elevation  - 2 x daily - 7 x weekly - 1 sets - 2 reps - 30 seconds hold - Forearm Pronation and Supination with Hammer  - 1 x daily - 7 x weekly - 3 sets - 10 reps - Seated Wrist Extension with Dumbbell  - 1 x daily - 7 x weekly - 3 sets - 10 reps - Seated Wrist Radial Deviation with Dumbbell  - 1 x daily - 7 x weekly - 3 sets - 10 reps  ASSESSMENT:  CLINICAL IMPRESSION: Increased popping without pain noted during resisted radial deviation with resistance band. Thumb flexion isometrics incorporated to isolate base of thumb musculature activation. Initial pain in thumb with therabar rainbows, however symptoms decreased with repetition and decrease in ROM.  OBJECTIVE IMPAIRMENTS: decreased ROM, decreased strength, increased fascial restrictions, impaired flexibility, and pain.   GOALS: Goals reviewed with patient? Yes  SHORT TERM GOALS: Target date: 04/23/23  The patient will be indep with HEP.  Baseline: initiated at eval Goal status: INITIAL  2.  The patient will tolerate passive overpressure into wrist flexion stretch without pain. Baseline:  Pain with wrist flexion Goal status: INITIAL  3.  The patient will tolerate A/ROM wrist extension without pain. Baseline:  sharp pain AROM wrist extension Goal status: INITIAL  LONG TERM GOALS: Target date: 05/21/23  The patient will progress HEP for post d/c program. Baseline:  Initiated at  eval Goal status: INITIAL  2.  The patient will report no incidence of shooting wrist pain with reaching. Baseline:  Notes reaching can lead to shooting R wrist pain. Goal status: INITIAL  3.  The patient will be able to tolerate wall lean with bilat Ues with equal ROM for wrist extension. Baseline:  Mild decrease R to L wrist extension Goal status: INITIAL  4.  The patient will be further tested for strength and R wrist will = L wrist for MMT. Baseline: Not tested due to pain. Goal status: INITIAL  PLAN:  PT FREQUENCY: 1-2x/week  PT DURATION: 8 weeks  PLANNED INTERVENTIONS: Therapeutic exercises, Therapeutic activity, Neuromuscular re-education, Balance training, Gait training, Patient/Family education, Self Care, Joint mobilization, Dry Needling, Cryotherapy, Moist heat, Taping, Ultrasound, Ionotophoresis 4mg /ml Dexamethasone, and Manual therapy  PLAN FOR NEXT SESSION: do MMT if tolerated, dry needling wrist extensors, eccentric strengthening, ROM to tolerance and STM.   Sanjuana Mae, PTA 04/09/2023, 4:24 PM   PHYSICAL THERAPY DISCHARGE SUMMARY  Visits from Start of Care: 4  Current functional level related to goals / functional outcomes: Decreased pain, improving mobility and function  Remaining deficits: See above   Education / Equipment: HEP   Patient agrees to discharge. Patient goals were partially met. Patient is being discharged due to not returning since the last visit. Reggy Eye, PT,DPT03/11/2509:01 AM

## 2023-04-15 ENCOUNTER — Ambulatory Visit: Payer: No Typology Code available for payment source | Admitting: Sports Medicine

## 2023-04-15 DIAGNOSIS — M654 Radial styloid tenosynovitis [de Quervain]: Secondary | ICD-10-CM

## 2023-04-15 NOTE — Assessment & Plan Note (Signed)
Now resolved after injection and therapy. However does still have pain that he localizes distal to the radial styloid, worse with flexion of his wrist. I am concerned more for radiocarpal osteoarthritis versus ligamentous injury. He will need MR arthrography, I am going to order this however I think he may postpone scheduling the arthrogram for another 6 to 8 weeks.

## 2023-04-15 NOTE — Progress Notes (Signed)
    Procedures performed today:    None.  Independent interpretation of notes and tests performed by another provider:   None.  Brief History, Exam, Impression, and Recommendations:    De Quervain's tenosynovitis, left Now resolved after injection and therapy. However does still have pain that he localizes distal to the radial styloid, worse with flexion of his wrist. I am concerned more for radiocarpal osteoarthritis versus ligamentous injury. He will need MR arthrography, I am going to order this however I think he may postpone scheduling the arthrogram for another 6 to 8 weeks.    ____________________________________________ Hector Chavez. Benjamin Stain, M.D., ABFM., CAQSM., AME. Primary Care and Sports Medicine [facility]  Adjunct Professor of Family Medicine  [employer]  Restaurant manager, fast food

## 2023-04-16 ENCOUNTER — Ambulatory Visit: Payer: No Typology Code available for payment source | Admitting: Physical Therapy

## 2023-04-29 ENCOUNTER — Ambulatory Visit: Payer: No Typology Code available for payment source | Admitting: Sports Medicine

## 2023-05-11 ENCOUNTER — Other Ambulatory Visit: Payer: Self-pay | Admitting: Family Medicine

## 2023-06-15 ENCOUNTER — Other Ambulatory Visit: Payer: Self-pay | Admitting: Family Medicine

## 2023-06-18 ENCOUNTER — Ambulatory Visit: Payer: No Typology Code available for payment source | Admitting: Sports Medicine

## 2023-07-24 ENCOUNTER — Encounter: Payer: Self-pay | Admitting: Family Medicine

## 2023-07-24 ENCOUNTER — Ambulatory Visit (INDEPENDENT_AMBULATORY_CARE_PROVIDER_SITE_OTHER): Payer: No Typology Code available for payment source | Admitting: Family Medicine

## 2023-07-24 VITALS — BP 118/67 | HR 67 | Ht 71.0 in | Wt 290.0 lb

## 2023-07-24 DIAGNOSIS — Z0000 Encounter for general adult medical examination without abnormal findings: Secondary | ICD-10-CM

## 2023-07-24 DIAGNOSIS — E782 Mixed hyperlipidemia: Secondary | ICD-10-CM

## 2023-07-24 DIAGNOSIS — Z1211 Encounter for screening for malignant neoplasm of colon: Secondary | ICD-10-CM | POA: Diagnosis not present

## 2023-07-24 DIAGNOSIS — I10 Essential (primary) hypertension: Secondary | ICD-10-CM

## 2023-07-24 DIAGNOSIS — Z125 Encounter for screening for malignant neoplasm of prostate: Secondary | ICD-10-CM

## 2023-07-24 MED ORDER — TADALAFIL 5 MG PO TABS: 5.0000 mg | ORAL_TABLET | Freq: Every day | ORAL | 3 refills | Status: DC

## 2023-07-24 MED ORDER — VALSARTAN-HYDROCHLOROTHIAZIDE 160-25 MG PO TABS: 1.0000 | ORAL_TABLET | Freq: Every day | ORAL | 3 refills | Status: DC

## 2023-07-24 MED ORDER — ATORVASTATIN CALCIUM 80 MG PO TABS: 80.0000 mg | ORAL_TABLET | Freq: Every day | ORAL | 3 refills | Status: DC

## 2023-07-24 NOTE — Assessment & Plan Note (Signed)
BP is well controlled today.  Continue valsartan/hydrochlorothiazide at current strength.

## 2023-07-24 NOTE — Assessment & Plan Note (Signed)
Well adult Orders Placed This Encounter  Procedures   Cologuard   CMP14+EGFR   Lipid Panel With LDL/HDL Ratio   TSH + free T4   PSA   CBC with Differential  Screenings: per lab orders.  Cologuard ordered.  Immunization:  UTD Anticipatory guidance/Risk factor reduction: Recommendations per AVS.

## 2023-07-24 NOTE — Progress Notes (Signed)
Hector Chavez - 52 y.o. male MRN [id number]  Date of birth: [birth year]  Subjective Chief Complaint  Patient presents with   Annual Exam    HPI Hector Chavez is a 52 y.o. male here today for annual exam.   He reports that he is doing pretty well.  He does admit to some increased stress related to work.  His wife feels that he needs to restart his medication for depression and anxiety, he isn't sure that he wants to restart this at this time.    BP is looking pretty good today.  Doing well with valsartan/hydrochlorothiazide and amlodipine.  Tolerating atorvastatin well at current strength.   His activity level is increased since they got a new dog.  Diet has not been great, admits to stress eating.  Weight is up slightly.    He is a non-smoker.  He has cut back on EtOH intake quite a bit.   Review of Systems  Constitutional:  Negative for chills, fever, malaise/fatigue and weight loss.  HENT:  Negative for congestion, ear pain and sore throat.   Eyes:  Negative for blurred vision, double vision and pain.  Respiratory:  Negative for cough and shortness of breath.   Cardiovascular:  Negative for chest pain and palpitations.  Gastrointestinal:  Negative for abdominal pain, blood in stool, constipation, heartburn and nausea.  Genitourinary:  Negative for dysuria and urgency.  Musculoskeletal:  Negative for joint pain and myalgias.  Neurological:  Negative for dizziness and headaches.  Endo/Heme/Allergies:  Does not bruise/bleed easily.  Psychiatric/Behavioral:  Negative for depression. The patient is not nervous/anxious and does not have insomnia.     Allergies  Allergen Reactions   Meloxicam Swelling    Caused feet/ankles to swell    Past Medical History:  Diagnosis Date   Allergy    Depression    Heart murmur    Hyperlipidemia    Hypertension     Past Surgical History:  Procedure Laterality Date   LT knee lateral release  1981   RT knee lateral release  1994    Social  History   Socioeconomic History   Marital status: Married    Spouse name: Not on file   Number of children: 1   Years of education: some college   Highest education level: Some college, no degree  Occupational History   Not on file  Tobacco Use   Smoking status: Former    Current packs/day: 0.00    Types: Cigarettes    Quit date: 12/24/2003    Years since quitting: 19.5   Smokeless tobacco: Never  Vaping Use   Vaping status: Never Used  Substance and Sexual Activity   Alcohol use: Not Currently    Comment: 01/21/22 -- one bottle of wine daily - cutting down to two glasses per day   Drug use: No   Sexual activity: Not on file  Other Topics Concern   Not on file  Social History Narrative   Production manager for [employer], married, 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   Right-handed.   No daily caffeine use.   Social Determinants of Health   Financial Resource Strain: Low Risk  (03/14/2023)   Overall Financial Resource Strain (CARDIA)    Difficulty of Paying Living Expenses: Not hard at all  Food Insecurity: No Food Insecurity (03/14/2023)   Hunger Vital Sign    Worried About Running Out of Food in the Last Year: Never true    Ran Out of  Food in the Last Year: Never true  Transportation Needs: No Transportation Needs (03/14/2023)   PRAPARE - Administrator, Civil Service (Medical): No    Lack of Transportation (Non-Medical): No  Physical Activity: Unknown (03/14/2023)   Exercise Vital Sign    Days of Exercise per Week: 0 days    Minutes of Exercise per Session: Not on file  Stress: No Stress Concern Present (03/14/2023)   [employer] of Occupational Health - Occupational Stress Questionnaire    Feeling of Stress : Only a little  Social Connections: Unknown (03/14/2023)   Social Connection and Isolation Panel [NHANES]    Frequency of Communication with Friends and Family: Patient declined    Frequency of Social Gatherings with Friends and Family: Patient  declined    Attends Religious Services: Patient declined    Database administrator or Organizations: No    Attends Engineer, structural: Not on file    Marital Status: Married    Family History  Problem Relation Age of Onset   Dementia Mother 49       (alzheimers)   Heart attack Father 31   Hypertension Father    Brain cancer Father    Cancer Other        lung/aunt/breast/cousins/mom and dads side   Depression Other        nephew    Health Maintenance  Topic Date Due   Fecal DNA (Cologuard)  04/25/2023   COVID-19 Vaccine (4 - 2023-24 season) 08/09/2023 (Originally 08/23/2022)   Zoster Vaccines- Shingrix (1 of 2) 10/24/2023 (Originally 04/19/2019)   INFLUENZA VACCINE  03/22/2024 (Originally 07/24/2023)   Hepatitis C Screening  07/23/2024 (Originally 04/19/1987)   HIV Screening  07/23/2024 (Originally 04/18/1984)   DTaP/Tdap/Td (2 - Td or Tdap) 01/23/2033   HPV VACCINES  Aged Out     ----------------------------------------------------------------------------------------------------------------------------------------------------------------------------------------------------------------- Physical Exam BP 118/67 (BP Location: Left Arm, Patient Position: Sitting, Cuff Size: Large)   Pulse 67   Ht 5\' 11"  (1.803 m)   Wt 290 lb (131.5 kg)   SpO2 96%   BMI 40.45 kg/m   Physical Exam Constitutional:      General: He is not in acute distress. HENT:     Head: Normocephalic and atraumatic.     Right Ear: Tympanic membrane and external ear normal.     Left Ear: Tympanic membrane and external ear normal.  Eyes:     General: No scleral icterus. Neck:     Thyroid: No thyromegaly.  Cardiovascular:     Rate and Rhythm: Normal rate and regular rhythm.     Heart sounds: Normal heart sounds.  Pulmonary:     Effort: Pulmonary effort is normal.     Breath sounds: Normal breath sounds.  Abdominal:     General: Bowel sounds are normal. There is no distension.     Palpations:  Abdomen is soft.     Tenderness: There is no abdominal tenderness. There is no guarding.  Musculoskeletal:     Cervical back: Normal range of motion.  Lymphadenopathy:     Cervical: No cervical adenopathy.  Skin:    General: Skin is warm and dry.     Findings: No rash.  Neurological:     Mental Status: He is alert and oriented to person, place, and time.     Cranial Nerves: No cranial nerve deficit.     Motor: No abnormal muscle tone.  Psychiatric:        Mood and Affect: Mood normal.  Behavior: Behavior normal.     ------------------------------------------------------------------------------------------------------------------------------------------------------------------------------------------------------------------- Assessment and Plan  Hypertension BP is well controlled today.  Continue valsartan/hydrochlorothiazide at current strength.    Well adult exam Well adult Orders Placed This Encounter  Procedures   Cologuard   CMP14+EGFR   Lipid Panel With LDL/HDL Ratio   TSH + free T4   PSA   CBC with Differential  Screenings: per lab orders.  Cologuard ordered.  Immunization:  UTD Anticipatory guidance/Risk factor reduction: Recommendations per AVS.    Meds ordered this encounter  Medications   atorvastatin (LIPITOR) 80 MG tablet    Sig: Take 1 tablet (80 mg total) by mouth daily.    Dispense:  90 tablet    Refill:  3   tadalafil (CIALIS) 5 MG tablet    Sig: Take 1 tablet (5 mg total) by mouth daily.    Dispense:  90 tablet    Refill:  3   valsartan-hydrochlorothiazide (DIOVAN-HCT) 160-25 MG tablet    Sig: Take 1 tablet by mouth daily.    Dispense:  90 tablet    Refill:  3    Return in about 6 months (around 01/24/2024) for HTN.    This visit occurred during the SARS-CoV-2 public health emergency.  Safety protocols were in place, including screening questions prior to the visit, additional usage of staff PPE, and extensive cleaning of exam room  while observing appropriate contact time as indicated for disinfecting solutions.

## 2023-07-24 NOTE — Patient Instructions (Signed)

## 2023-09-22 ENCOUNTER — Encounter (INDEPENDENT_AMBULATORY_CARE_PROVIDER_SITE_OTHER): Payer: No Typology Code available for payment source | Admitting: Family Medicine

## 2023-09-22 DIAGNOSIS — U071 COVID-19: Secondary | ICD-10-CM | POA: Diagnosis not present

## 2023-09-22 MED ORDER — NIRMATRELVIR/RITONAVIR (PAXLOVID)TABLET: 3.0000 | ORAL_TABLET | Freq: Two times a day (BID) | ORAL | 0 refills | Status: AC

## 2023-09-22 NOTE — Telephone Encounter (Signed)
Please see the MyChart message reply(ies) for my assessment and plan.    This patient gave consent for this Medical Advice Message and is aware that it may result in a bill to their insurance company, as well as the possibility of receiving a bill for a co-payment or deductible. They are an established patient, but are not seeking medical advice exclusively about a problem treated during an in person or video visit in the last seven days. I did not recommend an in person or video visit within seven days of my reply.    I spent a total of 7 minutes cumulative time within 7 days through MyChart messaging.  Alijah Hyde, DO   

## 2023-09-23 ENCOUNTER — Encounter: Payer: Self-pay | Admitting: Physician Assistant

## 2023-09-23 ENCOUNTER — Telehealth (INDEPENDENT_AMBULATORY_CARE_PROVIDER_SITE_OTHER): Payer: No Typology Code available for payment source | Admitting: Physician Assistant

## 2023-09-23 VITALS — Temp 103.0°F | Ht 71.0 in | Wt 277.0 lb

## 2023-09-23 DIAGNOSIS — U071 COVID-19: Secondary | ICD-10-CM | POA: Diagnosis not present

## 2023-09-23 DIAGNOSIS — R0981 Nasal congestion: Secondary | ICD-10-CM

## 2023-09-23 MED ORDER — FLUTICASONE PROPIONATE 50 MCG/ACT NA SUSP
2.0000 | Freq: Every day | NASAL | 0 refills | Status: DC
Start: 2023-09-23 — End: 2023-10-15

## 2023-09-23 NOTE — Progress Notes (Signed)
..Virtual Visit via Video Note  I connected with Hector Chavez on 09/23/23 at 11:30 AM EDT by a video enabled telemedicine application and verified that I am speaking with the correct person using two identifiers.  Location: Patient: home Provider: clinic  .Marland KitchenParticipating in visit:  Patient: Hector Chavez Provider: Tandy Gaw PA-C Provider in training: Weston Anna PA-S   I discussed the limitations of evaluation and management by telemedicine and the availability of in person appointments. The patient expressed understanding and agreed to proceed.  History of Present Illness: Pt is a 52 yo male with positive home covid 19 test and covid symptoms on Sunday, 2 days ago. PcP sent paxlovid to start but he has not started yet. He has a fever of 103. He has body aches, chills, headache, sinus pressure, nasal congestion and great fatigue. No problems breathing. His cough is intermittent and dry. Denies any leg swelling. No other sick contacts.    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 07/05/2020   Primary osteoarthritis of right hand 07/05/2020   Nodule of finger of right hand 05/17/2021   MDD (major depressive disorder) 05/17/2021   De Quervain's tenosynovitis, left 08/14/2021   Transient vision disturbance, left 01/04/2022   Hypertension 01/04/2022   Foreign body in head 01/07/2022   Blurry vision 01/21/2022   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 07/08/2022   Resolved Ambulatory Problems    Diagnosis Date Noted   PHARYNGITIS, STREPTOCOCCAL 01/15/2010   Mixed hyperlipidemia 07/27/2009   URI 01/26/2010   Gross hematuria  09/04/2010   ROTATOR CUFF SYNDROME, LEFT 07/27/2009   Lateral epicondylitis 06/27/2011   History of congenital heart defect 02/02/2017   Chronic prostatitis 02/02/2017   Benign prostatic hyperplasia with incomplete bladder emptying 02/02/2017   Moderate single current episode of major depressive disorder (HCC) 02/02/2017   Mixed dyslipidemia 02/02/2017   TIA (transient ischemic attack) 01/18/2022   Confusion 01/21/2022   History of recurrent TIAs 07/19/2022   Past Medical History:  Diagnosis Date   Allergy    Depression    Heart murmur       Observations/Objective:  Pale appearance Dry cough Normal breathing   .Marland Kitchen Today's Vitals   09/23/23 1131  Temp: (!) 103 F (39.4 C)  TempSrc: Other (Comment)  Weight: 277 lb (125.6 kg)  Height: 5\' 11"  (1.803 m)   Body mass index is 38.63 kg/m.    Assessment and Plan: Marland KitchenMarland KitchenDarris was seen today for cough.  Diagnoses and all orders for this visit:  COVID-19  Nasal congestion -     fluticasone (FLONASE) 50 MCG/ACT nasal spray; Place 2 sprays into both nostrils daily.   Day 2 of covid symptoms Start paxlovid, GFR 77.  Discussed symptomatic care Flonase sent to pharmacy Encouraged tylenol/ibuprofen/nasal saline rinses Stay quarantine for 5 days Written out of work for this week Wear mask for 10 days Discussed red flag covid symptoms and when to follow up   Follow Up Instructions:    I discussed the assessment and treatment plan with the patient. The patient was provided an opportunity to ask questions and all were answered. The patient agreed with the plan and demonstrated an understanding of  the instructions.   The patient was advised to call back or seek an in-person evaluation if the symptoms worsen or if the condition fails to improve as anticipated.   Tandy Gaw, PA-C

## 2023-09-23 NOTE — Progress Notes (Signed)
Pt tested positive for covid on Sunday , pt is taking day cold flu, pt states he had a fever of 103 f  on Sunday ,symptoms inside sinus congestion, sore throat and cough is intermitting and diarrhea

## 2023-09-23 NOTE — Patient Instructions (Signed)

## 2023-09-27 IMAGING — CT CT ANGIO HEAD-NECK (W OR W/O PERF)
1 of 10 series · 6 of 33 positions shown · non-contrast
Comparison: CT head MRI head 01/07/2022

CLINICAL DATA: Monocular vision loss.  Dizziness.

EXAM:
CT ANGIOGRAPHY HEAD AND NECK
TECHNIQUE: Multidetector CT imaging of the head and neck was performed using
the standard protocol during bolus administration of intravenous
contrast. Multiplanar CT image reconstructions and MIPs were
obtained to evaluate the vascular anatomy. Carotid stenosis
measurements (when applicable) are obtained utilizing NASCET
criteria, using the distal internal carotid diameter as the
denominator.

[Series 12: ax thin · axial · 0.54mm/px · z∈[-35,+240]mm · 6 of 385 slices shown]
[im 55/385  soft-tissue]
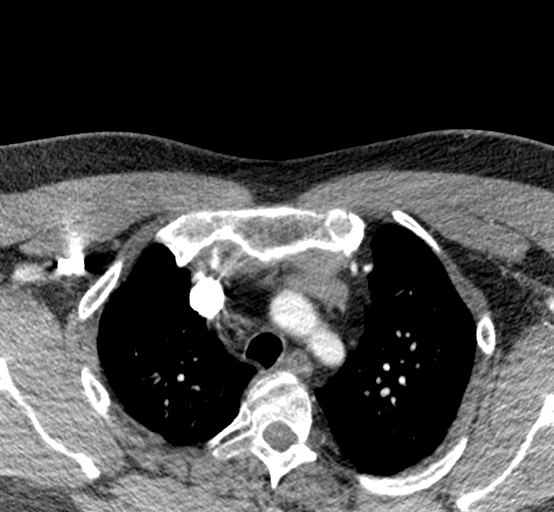
[im 110/385  bone]
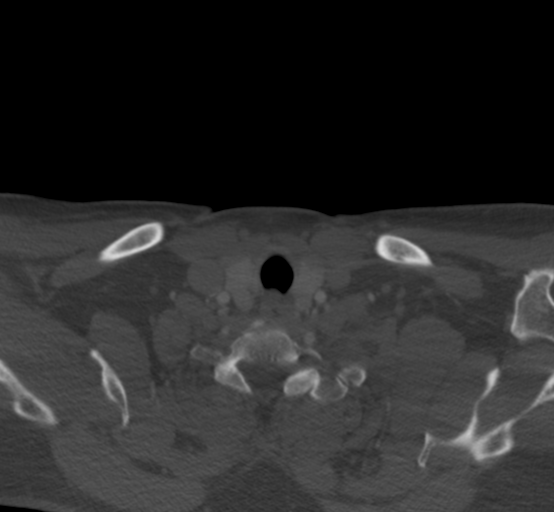
[im 165/385  soft-tissue]
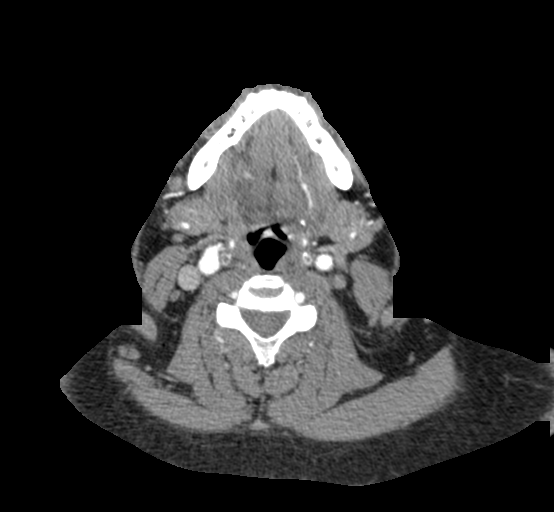
[im 220/385  bone]
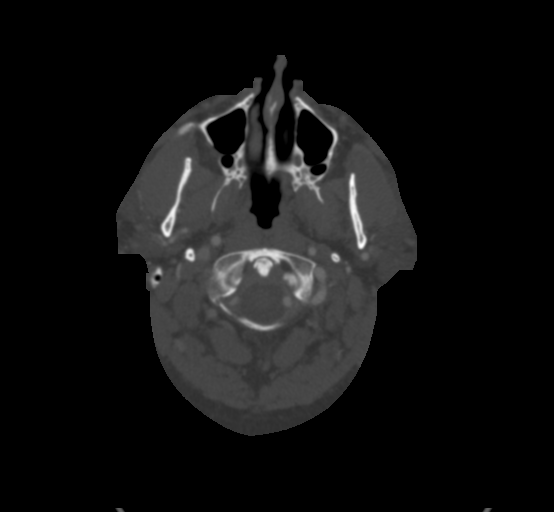
[im 275/385  soft-tissue]
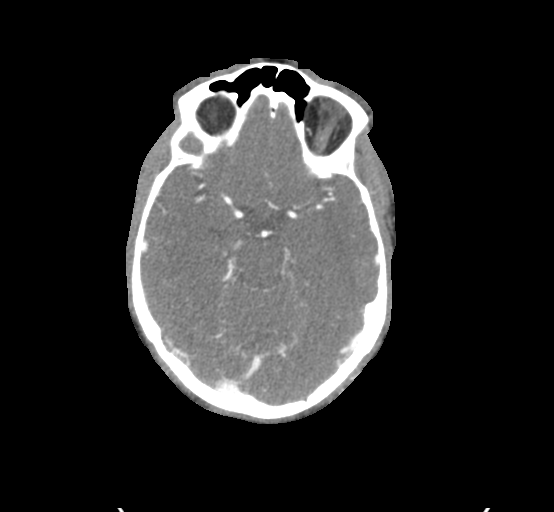
[im 330/385  bone]
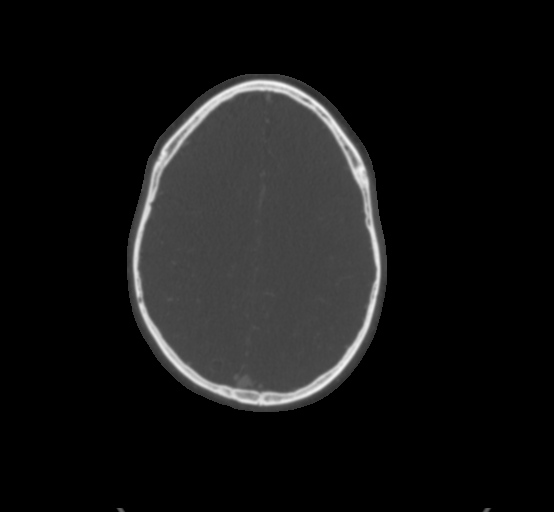

[6 of 33 positions shown; findings below may reference images not displayed]

RADIATION DOSE REDUCTION: This exam was performed according to the
departmental dose-optimization program which includes automated
exposure control, adjustment of the mA and/or kV according to
patient size and/or use of iterative reconstruction technique.

CONTRAST:  100mL OMNIPAQUE IOHEXOL 350 MG/ML SOLN
FINDINGS: CT HEAD FINDINGS

Brain: Approximally 5 mm metal foreign body in the right frontal
bone causes significant streak artifact. This is unchanged from the
prior CT.

Ventricle size normal.  Negative for infarct, hemorrhage, mass

Vascular: Negative for hyperdense vessel

Skull: Metal foreign body appears imbedded in the right frontal
bone. No acute skeletal abnormality

Sinuses: Mild mucosal edema paranasal sinuses.

Orbits: Negative

Review of the MIP images confirms the above findings

CTA NECK FINDINGS

Aortic arch: Standard branching. Imaged portion shows no evidence of
aneurysm or dissection. No significant stenosis of the major arch
vessel origins.

Right carotid system: Normal right carotid system. Negative for
atherosclerotic disease or stenosis.

Left carotid system: Normal left carotid. Negative for
atherosclerotic disease or stenosis.

Vertebral arteries: Normal vertebral arteries bilaterally. Left
vertebral artery patent. Both vertebral arteries patent to the
basilar

Skeleton: Negative

Other neck: Negative for soft tissue mass or adenopathy.

Upper chest: Lung apices clear bilaterally.

Review of the MIP images confirms the above findings

CTA HEAD FINDINGS

Anterior circulation: Internal carotid artery normal through the
skull base and cavernous segment. No significant calcification or
stenosis. Anterior and middle cerebral arteries normal bilaterally.
Negative for stenosis or vascular malformation.

Posterior circulation: Both vertebral arteries contribute to the
basilar. Left vertebral artery is dominant. PICA patent bilaterally.
Basilar widely patent. Superior cerebellar and posterior cerebral
arteries normal bilaterally.

Venous sinuses: Normal venous enhancement

Anatomic variants: None

Review of the MIP images confirms the above findings
IMPRESSION: 1. Negative CT head
2. 5 mm metal foreign body imbedded in the right frontal bone
unchanged from the prior study
3. Normal CT angiogram head and neck.

## 2023-10-15 ENCOUNTER — Other Ambulatory Visit: Payer: Self-pay | Admitting: Physician Assistant

## 2023-10-15 DIAGNOSIS — R0981 Nasal congestion: Secondary | ICD-10-CM

## 2023-10-25 ENCOUNTER — Encounter: Payer: Self-pay | Admitting: Family Medicine

## 2024-01-26 ENCOUNTER — Ambulatory Visit: Payer: No Typology Code available for payment source | Admitting: Family Medicine

## 2024-01-26 ENCOUNTER — Encounter: Payer: Self-pay | Admitting: Family Medicine

## 2024-01-26 VITALS — BP 113/70 | HR 63 | Ht 71.0 in | Wt 287.0 lb

## 2024-01-26 DIAGNOSIS — E782 Mixed hyperlipidemia: Secondary | ICD-10-CM

## 2024-01-26 DIAGNOSIS — H539 Unspecified visual disturbance: Secondary | ICD-10-CM

## 2024-01-26 DIAGNOSIS — F324 Major depressive disorder, single episode, in partial remission: Secondary | ICD-10-CM

## 2024-01-26 DIAGNOSIS — I10 Essential (primary) hypertension: Secondary | ICD-10-CM | POA: Diagnosis not present

## 2024-01-26 MED ORDER — HYDROXYZINE PAMOATE 25 MG PO CAPS: 25.0000 mg | ORAL_CAPSULE | Freq: Three times a day (TID) | ORAL | 0 refills | Status: AC | PRN

## 2024-01-26 MED ORDER — ESCITALOPRAM OXALATE 20 MG PO TABS: 20.0000 mg | ORAL_TABLET | Freq: Every day | ORAL | 1 refills | Status: DC

## 2024-01-26 NOTE — Patient Instructions (Addendum)
Try voltaren gel on hands.  Add lexapro back on at 1/2 tab x7 days then increase to a full tab.  See me again in 6 months.

## 2024-01-26 NOTE — Progress Notes (Signed)
Hector Chavez - 53 y.o. male MRN [id number]  Date of birth: 1971-01-17  Subjective Chief Complaint  Patient presents with   Hypertension    HPI Hector Chavez is a 53 y.o. male here today for follow up visit.   He reports that he is doing ok.   Notes some increased anxiety and would like to restart lexapro.  He did pretty well with this in the past. He has had some pain and stiffness in B/l hands, especially his thumbs.  He continues on combination of amlodipine and valsartan/hydrochlorothiazide.  BP today is well controlled.  He has not had symptoms including chest pain, shortness of breath, palpitations, headache or vision changes.   Continues on atorvastatin.  History of HLD and TIA.   ROS:  A comprehensive ROS was completed and negative except as noted per HPI  Allergies  Allergen Reactions   Meloxicam Swelling    Caused feet/ankles to swell    Past Medical History:  Diagnosis Date   Allergy    Depression    Heart murmur    Hyperlipidemia    Hypertension     Past Surgical History:  Procedure Laterality Date   LT knee lateral release  1981   RT knee lateral release  1994    Social History   Socioeconomic History   Marital status: Married    Spouse name: Not on file   Number of children: 1   Years of education: some college   Highest education level: Some college, no degree  Occupational History   Not on file  Tobacco Use   Smoking status: Former    Current packs/day: 0.00    Types: Cigarettes    Quit date: 12/24/2003    Years since quitting: 20.1   Smokeless tobacco: Never  Vaping Use   Vaping status: Never Used  Substance and Sexual Activity   Alcohol use: Not Currently    Comment: 01/21/22 -- one bottle of wine daily - cutting down to two glasses per day   Drug use: No   Sexual activity: Not on file  Other Topics Concern   Not on file  Social History Narrative   Production manager for [employer], married, 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    Right-handed.   No daily caffeine use.   Social Drivers of Corporate investment banker Strain: Low Risk  (03/14/2023)   Overall Financial Resource Strain (CARDIA)    Difficulty of Paying Living Expenses: Not hard at all  Food Insecurity: No Food Insecurity (03/14/2023)   Hunger Vital Sign    Worried About Running Out of Food in the Last Year: Never true    Ran Out of Food in the Last Year: Never true  Transportation Needs: No Transportation Needs (03/14/2023)   PRAPARE - Administrator, Civil Service (Medical): No    Lack of Transportation (Non-Medical): No  Physical Activity: Unknown (03/14/2023)   Exercise Vital Sign    Days of Exercise per Week: 0 days    Minutes of Exercise per Session: Not on file  Stress: No Stress Concern Present (03/14/2023)   [employer] of Occupational Health - Occupational Stress Questionnaire    Feeling of Stress : Only a little  Social Connections: Unknown (08/01/2023)   Received from [facility]   Social Network    Social Network: Not on file    Family History  Problem Relation Age of Onset   Dementia Mother 6       (  alzheimers)   Heart attack Father 59   Hypertension Father    Brain cancer Father    Cancer Other        lung/aunt/breast/cousins/mom and dads side   Depression Other        nephew    Health Maintenance  Topic Date Due   INFLUENZA VACCINE  03/22/2024 (Originally 07/24/2023)   COVID-19 Vaccine (4 - 2024-25 season) 04/22/2024 (Originally 08/24/2023)   Zoster Vaccines- Shingrix (1 of 2) 04/24/2024 (Originally 04/19/2019)   Hepatitis C Screening  07/23/2024 (Originally 04/19/1987)   HIV Screening  07/23/2024 (Originally 04/18/1984)   Fecal DNA (Cologuard)  08/18/2026   DTaP/Tdap/Td (2 - Td or Tdap) 01/23/2033   HPV VACCINES  Aged Out      ----------------------------------------------------------------------------------------------------------------------------------------------------------------------------------------------------------------- Physical Exam BP 113/70 (BP Location: Left Arm, Patient Position: Sitting, Cuff Size: Large)   Pulse 63   Ht 5\' 11"  (1.803 m)   Wt 287 lb (130.2 kg)   SpO2 97%   BMI 40.03 kg/m   Physical Exam Constitutional:      Appearance: Normal appearance.  Eyes:     General: No scleral icterus. Cardiovascular:     Rate and Rhythm: Normal rate and regular rhythm.  Pulmonary:     Effort: Pulmonary effort is normal.     Breath sounds: Normal breath sounds.  Musculoskeletal:     Cervical back: Neck supple.  Neurological:     Mental Status: He is alert.  Psychiatric:        Mood and Affect: Mood normal.        Behavior: Behavior normal.     ------------------------------------------------------------------------------------------------------------------------------------------------------------------------------------------------------------------- Assessment and Plan  Hyperlipidemia Continue atorvastatin at current strength.  Hypertension BP is well controlled today.  Continue valsartan/hydrochlorothiazide and amlodipine at current strength.    Transient vision disturbance, left Neurology felt this is likely more of a complicated migraine rather than TIA.  Remains off antiplatelet therapy at this time.  MDD (major depressive disorder) He has cut back on EtOH.  Adding lexapro back on.  He can add bupropion back on as well if he feels that he is not seeing adequate improvement with the lexapro alone.    Meds ordered this encounter  Medications   escitalopram (LEXAPRO) 20 MG tablet    Sig: Take 1 tablet (20 mg total) by mouth daily.    Dispense:  90 tablet    Refill:  1   hydrOXYzine (VISTARIL) 25 MG capsule    Sig: Take 1 capsule (25 mg total) by mouth 3 (three)  times daily as needed for anxiety.    Dispense:  30 capsule    Refill:  0    Return in about 6 months (around 07/25/2024) for Hypertension, Mood/BH.    This visit occurred during the SARS-CoV-2 public health emergency.  Safety protocols were in place, including screening questions prior to the visit, additional usage of staff PPE, and extensive cleaning of exam room while observing appropriate contact time as indicated for disinfecting solutions.

## 2024-01-26 NOTE — Assessment & Plan Note (Signed)
BP is well controlled today.  Continue valsartan/hydrochlorothiazide and amlodipine at current strength.

## 2024-01-26 NOTE — Assessment & Plan Note (Signed)
Neurology felt this is likely more of a complicated migraine rather than TIA.  Remains off antiplatelet therapy at this time.

## 2024-01-26 NOTE — Assessment & Plan Note (Signed)
He has cut back on EtOH.  Adding lexapro back on.  He can add bupropion back on as well if he feels that he is not seeing adequate improvement with the lexapro alone.

## 2024-01-26 NOTE — Assessment & Plan Note (Signed)
 Continue atorvastatin at current strength.

## 2024-03-18 ENCOUNTER — Encounter: Payer: Self-pay | Admitting: Family Medicine

## 2024-03-24 ENCOUNTER — Encounter: Payer: Self-pay | Admitting: Family Medicine

## 2024-03-24 ENCOUNTER — Ambulatory Visit: Admitting: Family Medicine

## 2024-03-24 VITALS — BP 138/77 | HR 63 | Ht 71.0 in | Wt 277.0 lb

## 2024-03-24 DIAGNOSIS — I10 Essential (primary) hypertension: Secondary | ICD-10-CM | POA: Diagnosis not present

## 2024-03-24 DIAGNOSIS — R5383 Other fatigue: Secondary | ICD-10-CM

## 2024-03-24 DIAGNOSIS — F324 Major depressive disorder, single episode, in partial remission: Secondary | ICD-10-CM

## 2024-03-24 MED ORDER — CARIPRAZINE HCL 1.5 MG PO CAPS: 1.5000 mg | ORAL_CAPSULE | Freq: Every day | ORAL | 0 refills | Status: DC

## 2024-03-24 MED ORDER — CARIPRAZINE HCL 3 MG PO CAPS: 3.0000 mg | ORAL_CAPSULE | Freq: Every day | ORAL | 1 refills | Status: DC

## 2024-03-24 NOTE — Progress Notes (Signed)
 Hector Chavez - 53 y.o. male MRN [id number]  Date of birth: [birth year]  Subjective Chief Complaint  Patient presents with   Depression    HPI Hector Chavez is a 53 year old male here today for follow-up visit.  He is accompanied by his wife at today's visit.  Reports that he has had increased depressive symptoms including decreased energy levels, lack of interest and motivation.  He is also concerned about his wife's health.  Previously work stress was contributing but this seems pretty good at this time.  His work performance is suffering due to his symptoms.  He has an increased appetite and has been consuming more alcohol.  He does stay up late some nights with difficulty falling asleep.  Usually about 1 bottle of wine each night.  He has been able to cut back and quit in the past.  No withdrawal symptoms previously.     03/24/2024    1:57 PM 01/26/2024    8:26 AM 01/23/2023    9:42 AM  Depression screen PHQ 2/9  Decreased Interest 3 2 1   Down, Depressed, Hopeless 3 1 1   PHQ - 2 Score 6 3 2   Altered sleeping 3 2 0  Tired, decreased energy 3 2 1   Change in appetite 3 3 3   Feeling bad or failure about yourself  3 2 1   Trouble concentrating 3 1 1   Moving slowly or fidgety/restless 3 0 0  Suicidal thoughts 2 0 0  PHQ-9 Score 26 13 8   Difficult doing work/chores Extremely dIfficult Very difficult Somewhat difficult      03/24/2024    1:57 PM 01/26/2024    8:26 AM 01/23/2023    9:42 AM 09/19/2022   10:02 AM  GAD 7 : Generalized Anxiety Score  Nervous, Anxious, on Edge 3 2 0 2  Control/stop worrying 3 2 0 1  Worry too much - different things 3 2 0 2  Trouble relaxing 3 2 0 2  Restless 3 2 1 2   Easily annoyed or irritable 3 3 0 1  Afraid - awful might happen 3 3 1 3   Total GAD 7 Score 21 16 2 13   Anxiety Difficulty Extremely difficult Very difficult Somewhat difficult Very difficult       ROS:  A comprehensive ROS was completed and negative except as noted per HPI    Allergies   Allergen Reactions   Meloxicam Swelling    Caused feet/ankles to swell    Past Medical History:  Diagnosis Date   Allergy    Depression    Heart murmur    Hyperlipidemia    Hypertension     Past Surgical History:  Procedure Laterality Date   LT knee lateral release  1981   RT knee lateral release  1994    Social History   Socioeconomic History   Marital status: Married    Spouse name: Not on file   Number of children: 1   Years of education: some college   Highest education level: Some college, no degree  Occupational History   Not on file  Tobacco Use   Smoking status: Former    Current packs/day: 0.00    Types: Cigarettes    Quit date: 12/24/2003    Years since quitting: 20.2   Smokeless tobacco: Never  Vaping Use   Vaping status: Never Used  Substance and Sexual Activity   Alcohol use: Not Currently    Comment: 01/21/22 -- one bottle of wine daily - cutting down to two  glasses per day   Drug use: No   Sexual activity: Not on file  Other Topics Concern   Not on file  Social History Narrative   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   Right-handed.   No daily caffeine use.   Social Drivers of Corporate investment banker Strain: Low Risk  (03/14/2023)   Overall Financial Resource Strain (CARDIA)    Difficulty of Paying Living Expenses: Not hard at all  Food Insecurity: No Food Insecurity (03/14/2023)   Hunger Vital Sign    Worried About Running Out of Food in the Last Year: Never true    Ran Out of Food in the Last Year: Never true  Transportation Needs: No Transportation Needs (03/14/2023)   PRAPARE - Administrator, Civil Service (Medical): No    Lack of Transportation (Non-Medical): No  Physical Activity: Unknown (03/14/2023)   Exercise Vital Sign    Days of Exercise per Week: 0 days    Minutes of Exercise per Session: Not on file  Stress: No Stress Concern Present (03/14/2023)   [employer] of  Occupational Health - Occupational Stress Questionnaire    Feeling of Stress : Only a little  Social Connections: Unknown (08/01/2023)   Received from [facility]   Social Network    Social Network: Not on file    Family History  Problem Relation Age of Onset   Dementia Mother 76       (alzheimers)   Heart attack Father 49   Hypertension Father    Brain cancer Father    Cancer Other        lung/aunt/breast/cousins/mom and dads side   Depression Other        nephew    Health Maintenance  Topic Date Due   COVID-19 Vaccine (4 - 2024-25 season) 04/22/2024 (Originally 08/24/2023)   Zoster Vaccines- Shingrix (1 of 2) 04/24/2024 (Originally 04/19/2019)   Hepatitis C Screening  07/23/2024 (Originally 04/19/1987)   HIV Screening  07/23/2024 (Originally 04/18/1984)   INFLUENZA VACCINE  07/23/2024   Fecal DNA (Cologuard)  08/18/2026   DTaP/Tdap/Td (2 - Td or Tdap) 01/23/2033   HPV VACCINES  Aged Out     ----------------------------------------------------------------------------------------------------------------------------------------------------------------------------------------------------------------- Physical Exam BP 138/77 (BP Location: Left Arm, Patient Position: Sitting, Cuff Size: Large)   Pulse 63   Ht 5\' 11"  (1.803 m)   Wt 277 lb (125.6 kg)   SpO2 99%   BMI 38.63 kg/m   Physical Exam Constitutional:      Appearance: Normal appearance.  HENT:     Head: Normocephalic and atraumatic.  Eyes:     General: No scleral icterus. Musculoskeletal:     Cervical back: Neck supple.  Neurological:     Mental Status: He is alert.  Psychiatric:        Mood and Affect: Mood normal.        Behavior: Behavior normal.     ------------------------------------------------------------------------------------------------------------------------------------------------------------------------------------------------------------------- Assessment and Plan  MDD (major depressive  disorder) He is having increased depressive symptoms with anhedonia and decreased energy levels.  We discussed how alcohol can function as a depressant and I advised him to reduce intake of this.  He will continue Lexapro at current strength.  Adding Vraylar as adjunctive therapy as well.  Discussed referral to therapy he is unsure about doing this.  I would like to see him back in 4 weeks.  Given information about behavioral health urgent care if having acute crisis.  He is aware to contact 911 if having increasing suicidal ideation.   Meds ordered this encounter  Medications   cariprazine (VRAYLAR) 1.5 MG capsule    Sig: Take 1 capsule (1.5 mg total) by mouth daily.    Dispense:  15 capsule    Refill:  0    Take x15 days and increase to 3mg  daily   cariprazine (VRAYLAR) 3 MG capsule    Sig: Take 1 capsule (3 mg total) by mouth daily.    Dispense:  30 capsule    Refill:  1    Return in about 4 weeks (around 04/21/2024) for Mood/BH.    This visit occurred during the SARS-CoV-2 public health emergency.  Safety protocols were in place, including screening questions prior to the visit, additional usage of staff PPE, and extensive cleaning of exam room while observing appropriate contact time as indicated for disinfecting solutions.

## 2024-03-24 NOTE — Patient Instructions (Signed)
 Continue lexapro.  Start vraylar 1.5mg  x15 days then increase to 3mg .  See me again in 4 weeks

## 2024-03-24 NOTE — Assessment & Plan Note (Signed)
 He is having increased depressive symptoms with anhedonia and decreased energy levels.  We discussed how alcohol can function as a depressant and I advised him to reduce intake of this.  He will continue Lexapro at current strength.  Adding Vraylar as adjunctive therapy as well.  Discussed referral to therapy he is unsure about doing this.  I would like to see him back in 4 weeks.  Given information about behavioral health urgent care if having acute crisis.  He is aware to contact 911 if having increasing suicidal ideation.

## 2024-03-25 LAB — CMP14+EGFR
ALT: 53 IU/L — ABNORMAL HIGH (ref 0–44)
AST: 38 IU/L (ref 0–40)
Albumin: 4.7 g/dL (ref 3.8–4.9)
Alkaline Phosphatase: 61 IU/L (ref 44–121)
BUN/Creatinine Ratio: 12 (ref 9–20)
BUN: 12 mg/dL (ref 6–24)
Bilirubin Total: 0.8 mg/dL (ref 0.0–1.2)
CO2: 25 mmol/L (ref 20–29)
Calcium: 9.5 mg/dL (ref 8.7–10.2)
Chloride: 100 mmol/L (ref 96–106)
Creatinine, Ser: 0.97 mg/dL (ref 0.76–1.27)
Globulin, Total: 2.8 g/dL (ref 1.5–4.5)
Glucose: 105 mg/dL — ABNORMAL HIGH (ref 70–99)
Potassium: 4 mmol/L (ref 3.5–5.2)
Sodium: 141 mmol/L (ref 134–144)
Total Protein: 7.5 g/dL (ref 6.0–8.5)
eGFR: 93 mL/min/{1.73_m2} (ref >59)

## 2024-03-25 LAB — CBC WITH DIFFERENTIAL/PLATELET
Basophils Absolute: 0 10*3/uL (ref 0.0–0.2)
Basos: 1 %
EOS (ABSOLUTE): 0.2 10*3/uL (ref 0.0–0.4)
Eos: 3 %
Hematocrit: 46.4 % (ref 37.5–51.0)
Hemoglobin: 15.8 g/dL (ref 13.0–17.7)
Immature Grans (Abs): 0 10*3/uL (ref 0.0–0.1)
Immature Granulocytes: 0 %
Lymphocytes Absolute: 2.2 10*3/uL (ref 0.7–3.1)
Lymphs: 31 %
MCH: 30.7 pg (ref 26.6–33.0)
MCHC: 34.1 g/dL (ref 31.5–35.7)
MCV: 90 fL (ref 79–97)
Monocytes Absolute: 0.6 10*3/uL (ref 0.1–0.9)
Monocytes: 8 %
Neutrophils Absolute: 4.2 10*3/uL (ref 1.4–7.0)
Neutrophils: 57 %
Platelets: 230 10*3/uL (ref 150–450)
RBC: 5.14 x10E6/uL (ref 4.14–5.80)
RDW: 12.4 % (ref 11.6–15.4)
WBC: 7.3 10*3/uL (ref 3.4–10.8)

## 2024-03-25 LAB — TSH: TSH: 1.59 u[IU]/mL (ref 0.450–4.500)

## 2024-03-31 ENCOUNTER — Encounter: Payer: Self-pay | Admitting: Family Medicine

## 2024-03-31 DIAGNOSIS — F324 Major depressive disorder, single episode, in partial remission: Secondary | ICD-10-CM

## 2024-03-31 NOTE — Telephone Encounter (Signed)
 BH referral pended for provider.

## 2024-04-02 ENCOUNTER — Encounter: Payer: Self-pay | Admitting: Family Medicine

## 2024-04-02 DIAGNOSIS — F1090 Alcohol use, unspecified, uncomplicated: Secondary | ICD-10-CM | POA: Insufficient documentation

## 2024-04-07 ENCOUNTER — Encounter: Payer: Self-pay | Admitting: Family Medicine

## 2024-04-20 ENCOUNTER — Encounter: Payer: Self-pay | Admitting: Professional

## 2024-04-20 ENCOUNTER — Ambulatory Visit (INDEPENDENT_AMBULATORY_CARE_PROVIDER_SITE_OTHER): Admitting: Professional

## 2024-04-20 DIAGNOSIS — F1090 Alcohol use, unspecified, uncomplicated: Secondary | ICD-10-CM

## 2024-04-20 DIAGNOSIS — F322 Major depressive disorder, single episode, severe without psychotic features: Secondary | ICD-10-CM

## 2024-04-20 DIAGNOSIS — F411 Generalized anxiety disorder: Secondary | ICD-10-CM | POA: Diagnosis not present

## 2024-04-20 NOTE — Progress Notes (Signed)
   Hector Chavez, [facility]
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
## 2024-04-21 ENCOUNTER — Ambulatory Visit: Admitting: Family Medicine

## 2024-04-21 ENCOUNTER — Encounter: Payer: Self-pay | Admitting: Family Medicine

## 2024-04-21 VITALS — BP 114/66 | HR 63 | Ht 71.0 in | Wt 276.0 lb

## 2024-04-21 DIAGNOSIS — F324 Major depressive disorder, single episode, in partial remission: Secondary | ICD-10-CM

## 2024-04-21 NOTE — Progress Notes (Signed)
 Hector Chavez - 53 y.o. male MRN [id number]  Date of birth: March 02, 1971  Subjective Chief Complaint  Patient presents with   Mood    HPI Hector Chavez is a 53 y.o. male here today for follow up visit.   At last visit he reported increased depressive symptoms including significant anhedonia and lack of motivation.  He was continued on lexapro  and vraylar  was added.  He did note some akathisia and elevation in BP and HR while taking this.  He has discontinued this as of a couple weeks ago.  He had first counseling session with Devera Fluke yesterday.  He is looking into intensive out patient day programs in the area.  He is not sure if he wants to add additional medication right now.   ROS:  A comprehensive ROS was completed and negative except as noted per HPI  Allergies  Allergen Reactions   Meloxicam  Swelling    Caused feet/ankles to swell    Past Medical History:  Diagnosis Date   Allergy    Depression    Heart murmur    Hyperlipidemia    Hypertension     Past Surgical History:  Procedure Laterality Date   LT knee lateral release  1981   RT knee lateral release  1994    Social History   Socioeconomic History   Marital status: Married    Spouse name: Not on file   Number of children: 1   Years of education: some college   Highest education level: Some college, no degree  Occupational History   Not on file  Tobacco Use   Smoking status: Former    Current packs/day: 0.00    Types: Cigarettes    Quit date: 12/24/2003    Years since quitting: 20.3   Smokeless tobacco: Never  Vaping Use   Vaping status: Never Used  Substance and Sexual Activity   Alcohol use: Not Currently    Comment: 01/21/22 -- one bottle of wine daily - cutting down to two glasses per day   Drug use: No   Sexual activity: Not on file  Other Topics Concern   Not on file  Social History Narrative   Production manager for [employer], married, 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   Right-handed.    No daily caffeine use.   Social Drivers of Corporate investment banker Strain: Low Risk  (04/19/2024)   Overall Financial Resource Strain (CARDIA)    Difficulty of Paying Living Expenses: Not very hard  Food Insecurity: No Food Insecurity (04/19/2024)   Hunger Vital Sign    Worried About Running Out of Food in the Last Year: Never true    Ran Out of Food in the Last Year: Never true  Transportation Needs: No Transportation Needs (04/19/2024)   PRAPARE - Administrator, Civil Service (Medical): No    Lack of Transportation (Non-Medical): No  Physical Activity: Insufficiently Active (04/19/2024)   Exercise Vital Sign    Days of Exercise per Week: 3 days    Minutes of Exercise per Session: 30 min  Stress: Stress Concern Present (04/19/2024)   [employer] of Occupational Health - Occupational Stress Questionnaire    Feeling of Stress : Very much  Social Connections: Unknown (04/19/2024)   Social Connection and Isolation Panel [NHANES]    Frequency of Communication with Friends and Family: Never    Frequency of Social Gatherings with Friends and Family: Patient declined    Attends Religious Services: Patient  declined    Active Member of Clubs or Organizations: No    Attends Engineer, structural: Not on file    Marital Status: Married    Family History  Problem Relation Age of Onset   Dementia Mother 89       (alzheimers)   Heart attack Father 63   Hypertension Father    Brain cancer Father    Cancer Other        lung/aunt/breast/cousins/mom and dads side   Depression Other        nephew    Health Maintenance  Topic Date Due   COVID-19 Vaccine (4 - 2024-25 season) 04/22/2024 (Originally 08/24/2023)   Zoster Vaccines- Shingrix (1 of 2) 04/24/2024 (Originally 04/19/2019)   Hepatitis C Screening  07/23/2024 (Originally 04/19/1987)   HIV Screening  07/23/2024 (Originally 04/18/1984)   INFLUENZA VACCINE  07/23/2024   Fecal DNA (Cologuard)  08/18/2026    DTaP/Tdap/Td (2 - Td or Tdap) 01/23/2033   HPV VACCINES  Aged Out   Meningococcal B Vaccine  Aged Out     ----------------------------------------------------------------------------------------------------------------------------------------------------------------------------------------------------------------- Physical Exam BP 114/66 (BP Location: Left Arm, Patient Position: Sitting, Cuff Size: Large)   Pulse 63   Ht 5\' 11"  (1.803 m)   Wt 276 lb (125.2 kg)   SpO2 97%   BMI 38.49 kg/m   Physical Exam Constitutional:      Appearance: Normal appearance.  Eyes:     General: No scleral icterus. Cardiovascular:     Rate and Rhythm: Normal rate and regular rhythm.  Pulmonary:     Effort: Pulmonary effort is normal.     Breath sounds: Normal breath sounds.  Musculoskeletal:     Cervical back: Neck supple.  Neurological:     Mental Status: He is alert.  Psychiatric:        Mood and Affect: Mood normal.        Behavior: Behavior normal.     ------------------------------------------------------------------------------------------------------------------------------------------------------------------------------------------------------------------- Assessment and Plan  MDD (major depressive disorder) Did not tolerate Vraylar  well.  He will continue lexapro  at current strength.  Continue with therapist and encouraged intensive day program through [facility] or [facility].  Follow-up in 6 to 8 weeks.   No orders of the defined types were placed in this encounter.   No follow-ups on file.

## 2024-04-21 NOTE — Assessment & Plan Note (Addendum)
 Did not tolerate Vraylar  well.  He will continue lexapro  at current strength.  Continue with therapist and encouraged intensive day program through [facility] or [facility].  Follow-up in 6 to 8 weeks.

## 2024-05-03 ENCOUNTER — Encounter: Payer: Self-pay | Admitting: Family Medicine

## 2024-06-03 ENCOUNTER — Ambulatory Visit: Admitting: Family Medicine

## 2024-07-14 ENCOUNTER — Other Ambulatory Visit: Payer: Self-pay | Admitting: Family Medicine

## 2024-07-26 ENCOUNTER — Ambulatory Visit: Payer: No Typology Code available for payment source | Admitting: Family Medicine

## 2024-08-14 ENCOUNTER — Other Ambulatory Visit: Payer: Self-pay | Admitting: Family Medicine

## 2024-08-24 ENCOUNTER — Encounter: Payer: Self-pay | Admitting: Sports Medicine

## 2024-09-11 ENCOUNTER — Other Ambulatory Visit: Payer: Self-pay | Admitting: Family Medicine

## 2024-09-29 ENCOUNTER — Other Ambulatory Visit: Payer: Self-pay | Admitting: Family Medicine

## 2024-09-29 NOTE — Telephone Encounter (Signed)
 Pls contact pt to schedule HTN and Mood appt with Dr. Alvia. Sending 30 day med refill. Thx.

## 2024-10-04 ENCOUNTER — Ambulatory Visit: Admitting: Family Medicine

## 2024-10-19 ENCOUNTER — Other Ambulatory Visit: Payer: Self-pay | Admitting: Family Medicine

## 2024-10-19 ENCOUNTER — Encounter: Payer: Self-pay | Admitting: Family Medicine

## 2024-10-19 DIAGNOSIS — N400 Enlarged prostate without lower urinary tract symptoms: Secondary | ICD-10-CM

## 2024-10-30 ENCOUNTER — Other Ambulatory Visit: Payer: Self-pay | Admitting: Family Medicine

## 2024-11-03 ENCOUNTER — Other Ambulatory Visit: Payer: Self-pay | Admitting: Family Medicine

## 2024-11-03 ENCOUNTER — Ambulatory Visit (INDEPENDENT_AMBULATORY_CARE_PROVIDER_SITE_OTHER): Admitting: Family Medicine

## 2024-11-03 VITALS — BP 124/73 | HR 60 | Ht 71.0 in | Wt 277.0 lb

## 2024-11-03 DIAGNOSIS — N401 Enlarged prostate with lower urinary tract symptoms: Secondary | ICD-10-CM

## 2024-11-03 DIAGNOSIS — Z23 Encounter for immunization: Secondary | ICD-10-CM

## 2024-11-03 DIAGNOSIS — E782 Mixed hyperlipidemia: Secondary | ICD-10-CM | POA: Diagnosis not present

## 2024-11-03 DIAGNOSIS — I10 Essential (primary) hypertension: Secondary | ICD-10-CM | POA: Diagnosis not present

## 2024-11-03 DIAGNOSIS — F324 Major depressive disorder, single episode, in partial remission: Secondary | ICD-10-CM

## 2024-11-03 MED ORDER — ATORVASTATIN CALCIUM 80 MG PO TABS: 80.0000 mg | ORAL_TABLET | Freq: Every day | ORAL | 3 refills | Status: AC

## 2024-11-03 MED ORDER — ESCITALOPRAM OXALATE 20 MG PO TABS: 20.0000 mg | ORAL_TABLET | Freq: Every day | ORAL | 1 refills | Status: AC

## 2024-11-03 MED ORDER — AMLODIPINE BESYLATE 5 MG PO TABS
5.0000 mg | ORAL_TABLET | Freq: Every day | ORAL | 3 refills | Status: AC
Start: 2024-11-03 — End: ?

## 2024-11-03 MED ORDER — VALSARTAN-HYDROCHLOROTHIAZIDE 160-25 MG PO TABS: 1.0000 | ORAL_TABLET | Freq: Every day | ORAL | 3 refills | Status: AC

## 2024-11-03 NOTE — Assessment & Plan Note (Signed)
 BP is well controlled today.  Continue valsartan/hydrochlorothiazide and amlodipine at current strength.

## 2024-11-03 NOTE — Assessment & Plan Note (Signed)
 He will continue lexapro  at current strength.  Continue with therapist.

## 2024-11-03 NOTE — Addendum Note (Signed)
 Addended by: OLEY CHIQUITA CROME on: 11/03/2024 02:27 PM   Modules accepted: Orders

## 2024-11-03 NOTE — Assessment & Plan Note (Signed)
 Continue low strength tadalafil .  No further episodes of hematuria/hematospermia

## 2024-11-03 NOTE — Assessment & Plan Note (Signed)
 Continue atorvastatin at current strength.

## 2024-11-03 NOTE — Telephone Encounter (Signed)
 Patient informed of need for appt - scheduled for today =kph

## 2024-11-03 NOTE — Progress Notes (Signed)
 Hector Chavez - 53 y.o. male MRN [id number]  Date of birth: Jul 05, 1971  Subjective Chief Complaint  Patient presents with   Medical Management of Chronic Issues   Hypertension    HPI Hector Chavez is a 53 y.o. male here today for follow up visit.   He reports that he is doing pretty well.   He remains on valsartan /hydrochlorothiazide  , and amlodipine  for management of HTN.  BP is mildly elevated on initial check.  He has not had symptoms related to HTN including chest pain, shortness of breath, palpitations, headache or vision changes.     Mood is stable with lexapro  at this time. No significant side effects.   ROS:  A comprehensive ROS was completed and negative except as noted per HPI   Allergies  Allergen Reactions   Meloxicam  Swelling    Caused feet/ankles to swell    Past Medical History:  Diagnosis Date   Allergy    Depression    Heart murmur    Hyperlipidemia    Hypertension     Past Surgical History:  Procedure Laterality Date   LT knee lateral release  1981   RT knee lateral release  1994    Social History   Socioeconomic History   Marital status: Married    Spouse name: Not on file   Number of children: 1   Years of education: some college   Highest education level: Some college, no degree  Occupational History   Not on file  Tobacco Use   Smoking status: Former    Current packs/day: 0.00    Types: Cigarettes    Quit date: 12/24/2003    Years since quitting: 20.8   Smokeless tobacco: Never  Vaping Use   Vaping status: Never Used  Substance and Sexual Activity   Alcohol use: Not Currently    Comment: 01/21/22 -- one bottle of wine daily - cutting down to two glasses per day   Drug use: No   Sexual activity: Not on file  Other Topics Concern   Not on file  Social History Narrative   Production Manager for [employer], married, 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   Right-handed.   No daily caffeine use.   Social Drivers of Research Scientist (physical Sciences) Strain: Low Risk  (11/03/2024)   Overall Financial Resource Strain (CARDIA)    Difficulty of Paying Living Expenses: Not hard at all  Food Insecurity: No Food Insecurity (11/03/2024)   Hunger Vital Sign    Worried About Running Out of Food in the Last Year: Never true    Ran Out of Food in the Last Year: Never true  Transportation Needs: No Transportation Needs (11/03/2024)   PRAPARE - Administrator, Civil Service (Medical): No    Lack of Transportation (Non-Medical): No  Physical Activity: Inactive (11/03/2024)   Exercise Vital Sign    Days of Exercise per Week: 0 days    Minutes of Exercise per Session: Not on file  Stress: Stress Concern Present (11/03/2024)   [employer] of Occupational Health - Occupational Stress Questionnaire    Feeling of Stress: To some extent  Social Connections: Socially Isolated (11/03/2024)   Social Connection and Isolation Panel    Frequency of Communication with Friends and Family: Once a week    Frequency of Social Gatherings with Friends and Family: Once a week    Attends Religious Services: Never    Database Administrator or Organizations: No  Attends Banker Meetings: Not on file    Marital Status: Married    Family History  Problem Relation Age of Onset   Dementia Mother 91       (alzheimers)   Heart attack Father 57   Hypertension Father    Brain cancer Father    Cancer Other        lung/aunt/breast/cousins/mom and dads side   Depression Other        nephew    Health Maintenance  Topic Date Due   HIV Screening  Never done   Hepatitis C Screening  Never done   Hepatitis B Vaccines 19-59 Average Risk (1 of 3 - 19+ 3-dose series) Never done   Pneumococcal Vaccine: 50+ Years (1 of 1 - PCV) Never done   Zoster Vaccines- Shingrix (1 of 2) Never done   COVID-19 Vaccine (4 - 2025-26 season) 08/23/2024   Influenza Vaccine  03/22/2025 (Originally 07/23/2024)   Fecal DNA (Cologuard)  08/18/2026    DTaP/Tdap/Td (2 - Td or Tdap) 01/23/2033   HPV VACCINES  Aged Out   Meningococcal B Vaccine  Aged Out     ----------------------------------------------------------------------------------------------------------------------------------------------------------------------------------------------------------------- Physical Exam BP 124/73   Pulse 60   Ht 5' 11 (1.803 m)   Wt 277 lb (125.6 kg)   SpO2 98%   BMI 38.63 kg/m   Physical Exam Constitutional:      Appearance: Normal appearance.  HENT:     Head: Normocephalic and atraumatic.  Cardiovascular:     Rate and Rhythm: Normal rate and regular rhythm.  Pulmonary:     Effort: Pulmonary effort is normal.     Breath sounds: Normal breath sounds.  Neurological:     Mental Status: He is alert.  Psychiatric:        Mood and Affect: Mood normal.        Behavior: Behavior normal.     ------------------------------------------------------------------------------------------------------------------------------------------------------------------------------------------------------------------- Assessment and Plan  MDD (major depressive disorder) He will continue lexapro  at current strength.  Continue with therapist.    Hyperlipidemia Continue atorvastatin  at current strength.  Hypertension BP is well controlled today.  Continue valsartan /hydrochlorothiazide  and amlodipine  at current strength.    BPH (benign prostatic hyperplasia) Continue low strength tadalafil .  No further episodes of hematuria/hematospermia   Meds ordered this encounter  Medications   amLODipine  (NORVASC ) 5 MG tablet    Sig: Take 1 tablet (5 mg total) by mouth daily.    Dispense:  90 tablet    Refill:  3   atorvastatin  (LIPITOR) 80 MG tablet    Sig: Take 1 tablet (80 mg total) by mouth daily.    Dispense:  90 tablet    Refill:  3   escitalopram  (LEXAPRO ) 20 MG tablet    Sig: Take 1 tablet (20 mg total) by mouth daily.    Dispense:  90 tablet     Refill:  1   valsartan -hydrochlorothiazide  (DIOVAN -HCT) 160-25 MG tablet    Sig: Take 1 tablet by mouth daily.    Dispense:  90 tablet    Refill:  3    Return in about 6 months (around 05/03/2025) for Hypertension, Mood/BH.

## 2025-05-03 ENCOUNTER — Ambulatory Visit: Admitting: Family Medicine
